# Patient Record
Sex: Male | Born: 1980 | Race: Black or African American | Hispanic: No | Marital: Married | Smoking: Former smoker
Health system: Southern US, Community
[De-identification: ages and names within clinical notes are randomized; demographics above are authoritative.]

## PROBLEM LIST (undated history)

## (undated) ENCOUNTER — Emergency Department (HOSPITAL_COMMUNITY): Admission: EM | Payer: BLUE CROSS/BLUE SHIELD | Source: Home / Self Care

## (undated) DIAGNOSIS — G5600 Carpal tunnel syndrome, unspecified upper limb: Secondary | ICD-10-CM

## (undated) DIAGNOSIS — F4310 Post-traumatic stress disorder, unspecified: Secondary | ICD-10-CM

## (undated) DIAGNOSIS — G47411 Narcolepsy with cataplexy: Secondary | ICD-10-CM

## (undated) DIAGNOSIS — H00019 Hordeolum externum unspecified eye, unspecified eyelid: Secondary | ICD-10-CM

## (undated) HISTORY — PX: ORIF MANDIBULAR FRACTURE: SHX2127

## (undated) HISTORY — DX: Hordeolum externum unspecified eye, unspecified eyelid: H00.019

## (undated) HISTORY — DX: Narcolepsy with cataplexy: G47.411

---

## 2016-09-14 ENCOUNTER — Ambulatory Visit (HOSPITAL_COMMUNITY)
Admission: EM | Admit: 2016-09-14 | Discharge: 2016-09-14 | Disposition: A | Discharge status: Home / Self Care | Payer: BLUE CROSS/BLUE SHIELD | Attending: Family Medicine | Admitting: Family Medicine

## 2016-09-14 ENCOUNTER — Encounter (HOSPITAL_COMMUNITY): Payer: Self-pay | Admitting: *Deleted

## 2016-09-14 DIAGNOSIS — H00024 Hordeolum internum left upper eyelid: Secondary | ICD-10-CM

## 2016-09-14 NOTE — ED Provider Notes (Signed)
[facility]    CSN: [id number] Arrival date & time: 09/14/16  1438   History   Chief Complaint Chief Complaint  Patient presents with  . Eye Problem    HPI Oscar Mercado is a 36 y.o. male presenting for left eyelid swelling and tenderness.   He's had 3 days of constant, worsening, gradual onset left upper eyelid swelling despite frequent warm compresses. This is a recurrent problem for him which usually resolved over the course of 7-10 days. He presented today from work because the irritation was so bad. He reports intermittent blurring of vision but no pain with eye movements, no fever.   HPI  History reviewed. No pertinent past medical history.  There are no active problems to display for this patient.   History reviewed. No pertinent surgical history.   Home Medications    Prior to Admission medications   Not on File   Family History No family history on file.  Social History Social History  Substance Use Topics  . Smoking status: Former Games developermoker  . Smokeless tobacco: Never Used  . Alcohol use No     Allergies   Patient has no known allergies.   Review of Systems Review of Systems Per HPI  Physical Exam Triage Vital Signs ED Triage Vitals  Enc Vitals Group     BP 09/14/16 1508 130/80     Pulse Rate 09/14/16 1508 78     Resp 09/14/16 1508 18     Temp 09/14/16 1508 98.9 F (37.2 C)     Temp Source 09/14/16 1508 Oral     SpO2 09/14/16 1508 100 %     Weight --      Height --      Head Circumference --      Peak Flow --      Pain Score 09/14/16 1502 5     Pain Loc --      Pain Edu? --      Excl. in GC? --    No data found.   Updated Vital Signs BP 130/80 (BP Location: Right Arm)   Pulse 78   Temp 98.9 F (37.2 C) (Oral)   Resp 18   SpO2 100%   Visual Acuity Right Eye Distance: 20/13 Left Eye Distance: 20/15 Bilateral Distance: 20/13  Physical Exam  Constitutional: He appears well-developed and well-nourished. No  distress.  HENT:  Head: Normocephalic.  Eyes:  Left upper eyelid diffusely edematous without erythema or focal induration/purulence. Conjunctivae normal, sclerae normal, PERRL, painless EOM's, and visual acuity not impaired.    UC Treatments / Results  Labs (all labs ordered are listed, but only abnormal results are displayed) Labs Reviewed - No data to display  EKG  EKG Interpretation None       Radiology No results found.  Procedures Procedures (including critical care time)  Medications Ordered in UC Medications - No data to display   Initial Impression / Assessment and Plan / UC Course  I have reviewed the triage vital signs and the nursing notes.  Pertinent labs & imaging results that were available during my care of the patient were reviewed by me and considered in my medical decision making (see chart for details).  Final Clinical Impressions(s) / UC Diagnoses   Final diagnoses:  Hordeolum internum left upper eyelid   36 y.o. male with evidence of left upper eyelid hordeolum, recurrent. Visual acuity not impaired, no evidence of cellulitis.  - Warm compresses and massage to facilitate drainage -  Referred to ophthalmology if no resolution in 1 - 2 weeks, especially given recurrent nature.    Tyrone Nine, MD 09/14/16 [phone]

## 2016-09-14 NOTE — ED Triage Notes (Signed)
Noticed   A  Bump  On  l    Eye     X    sev   Days    Swelling     Eye  Area      Irritation       When  He  Blinks  Has  Had  This  Problem in  Past

## 2018-03-17 ENCOUNTER — Ambulatory Visit (HOSPITAL_COMMUNITY)
Admission: EM | Admit: 2018-03-17 | Discharge: 2018-03-17 | Disposition: A | Discharge status: Home / Self Care | Payer: BLUE CROSS/BLUE SHIELD | Attending: Internal Medicine | Admitting: Internal Medicine

## 2018-03-17 ENCOUNTER — Encounter (HOSPITAL_COMMUNITY): Payer: Self-pay

## 2018-03-17 DIAGNOSIS — G4710 Hypersomnia, unspecified: Secondary | ICD-10-CM | POA: Insufficient documentation

## 2018-03-17 NOTE — ED Triage Notes (Signed)
Pt states needs a referral to a specialist

## 2018-03-17 NOTE — ED Triage Notes (Signed)
Pt c/o sleep disorder x1 yr where he is sleeping to much; denies pain or any other complaints

## 2018-03-17 NOTE — Discharge Instructions (Signed)
Contact information for some local sleep specialists and primary care providers is located in this handout.

## 2018-03-17 NOTE — ED Provider Notes (Signed)
[facility]    CSN: [id number] Arrival date & time: 03/17/18  1608     History   Chief Complaint Chief Complaint  Patient presents with  . sleeping disorder    HPI Oscar Mercado is a 38 y.o. male.   He presents today with a 1 years history of increasing sleepiness.  This is affecting his job performance.  He works nights, 4 nights per week, as a stand-up fork truck driver.  He has not had any accidents, and has not fallen down, but feels sometimes like his knees might buckle unexpectedly and he might fall.  He also feels like his speech might be slurred sometimes but he has checked with friends/family and they do not agree that his speech is affected. He gives a history of snoring and has been told several times that he snores.  He typically sleeps 5 to 6 hours per night and feels well rested about 40% of the time, after sleeping.  He was in the [employer] for 10 years and was told there that only 4 hours of sleep per night is required. He is concerned about narcolepsy/cataplexy.    HPI  History reviewed. No pertinent past medical history.  History reviewed. No pertinent surgical history.     Home Medications   Takes no medications regularly  Family History No family history on file.  Social History Social History   Tobacco Use  . Smoking status: Former Games developer  . Smokeless tobacco: Never Used  Substance Use Topics  . Alcohol use: No  . Drug use: Not on file     Allergies   Patient has no known allergies.   Review of Systems Review of Systems  All other systems reviewed and are negative.    Physical Exam Triage Vital Signs ED Triage Vitals  Enc Vitals Group     BP 03/17/18 1701 113/72     Pulse Rate 03/17/18 1701 78     Resp 03/17/18 1701 18     Temp 03/17/18 1701 98.8 F (37.1 C)     Temp Source 03/17/18 1701 Oral     SpO2 03/17/18 1701 98 %     Weight --      Height --      Pain Score 03/17/18 1702 0     Pain Loc --    Updated  Vital Signs BP 113/72 (BP Location: Left Arm)   Pulse 78   Temp 98.8 F (37.1 C) (Oral)   Resp 18   SpO2 98%  Physical Exam Vitals signs and nursing note reviewed.  Constitutional:      General: He is not in acute distress.    Comments: Alert, nicely groomed Sitting upright on exam table.  Affect is blunted, and appears tired.  Is awake and attentive throughout the entire exam.  HENT:     Head: Atraumatic.  Eyes:     Comments: Conjugate gaze, no eye redness/drainage  Neck:     Musculoskeletal: Neck supple.  Cardiovascular:     Rate and Rhythm: Normal rate and regular rhythm.  Pulmonary:     Effort: No respiratory distress.     Breath sounds: No wheezing or rales.     Comments: Lungs clear, symmetric breath sounds  Abdominal:     General: There is no distension.  Musculoskeletal: Normal range of motion.     Comments: No leg swelling  Skin:    General: Skin is warm and dry.     Comments: No cyanosis  Neurological:  Mental Status: He is alert and oriented to person, place, and time.       Final Clinical Impressions(s) / UC Diagnoses   Final diagnoses:  Hypersomnia     Discharge Instructions     Contact information for some local sleep specialists and primary care providers is located in this handout.     ED Prescriptions    None        Isa RankinMurray, Rafeef Lau Wilson, MD 03/19/18 2212

## 2018-03-23 ENCOUNTER — Telehealth (HOSPITAL_COMMUNITY): Payer: Self-pay | Admitting: Emergency Medicine

## 2018-03-23 NOTE — Telephone Encounter (Signed)
Patient called and left voicemail asking for referral for sleep study. We do not do referrals at [facility], attempted to contact patient and encourage him to follow up with PCP for referral. No answer.

## 2018-05-13 ENCOUNTER — Emergency Department (HOSPITAL_COMMUNITY): Payer: BLUE CROSS/BLUE SHIELD

## 2018-05-13 ENCOUNTER — Encounter (HOSPITAL_COMMUNITY): Payer: Self-pay | Admitting: Emergency Medicine

## 2018-05-13 ENCOUNTER — Emergency Department (HOSPITAL_COMMUNITY)
Admission: EM | Admit: 2018-05-13 | Discharge: 2018-05-13 | Disposition: A | Discharge status: Home / Self Care | Payer: BLUE CROSS/BLUE SHIELD | Attending: Emergency Medicine | Admitting: Emergency Medicine

## 2018-05-13 DIAGNOSIS — Y929 Unspecified place or not applicable: Secondary | ICD-10-CM | POA: Diagnosis not present

## 2018-05-13 DIAGNOSIS — Y999 Unspecified external cause status: Secondary | ICD-10-CM | POA: Insufficient documentation

## 2018-05-13 DIAGNOSIS — X58XXXA Exposure to other specified factors, initial encounter: Secondary | ICD-10-CM | POA: Insufficient documentation

## 2018-05-13 DIAGNOSIS — Z87891 Personal history of nicotine dependence: Secondary | ICD-10-CM | POA: Insufficient documentation

## 2018-05-13 DIAGNOSIS — S39012A Strain of muscle, fascia and tendon of lower back, initial encounter: Secondary | ICD-10-CM | POA: Insufficient documentation

## 2018-05-13 DIAGNOSIS — R1032 Left lower quadrant pain: Secondary | ICD-10-CM | POA: Diagnosis present

## 2018-05-13 DIAGNOSIS — Y9389 Activity, other specified: Secondary | ICD-10-CM | POA: Insufficient documentation

## 2018-05-13 LAB — URINALYSIS, ROUTINE W REFLEX MICROSCOPIC
Bilirubin Urine: NEGATIVE
Glucose, UA: NEGATIVE mg/dL
HGB URINE DIPSTICK: NEGATIVE
Ketones, ur: NEGATIVE mg/dL
Leukocytes,Ua: NEGATIVE
Nitrite: NEGATIVE
PH: 6 (ref 5.0–8.0)
PROTEIN: NEGATIVE mg/dL
SPECIFIC GRAVITY, URINE: 1.005 (ref 1.005–1.030)

## 2018-05-13 IMAGING — CT CT RENAL STONE PROTOCOL
2 of 4 series · 15 of 46 positions shown, 17 images · non-contrast
Comparison: None.

CLINICAL DATA: Left flank pain for a week.

EXAM:
CT ABDOMEN AND PELVIS WITHOUT CONTRAST
TECHNIQUE: Multidetector CT imaging of the abdomen and pelvis was performed
following the standard protocol without IV contrast.

[Series 3: renal stone 5.0 · axial · 0.61mm/px · z∈[+822,+1207]mm · 12 of 89 slices shown, 14 images]
[im 8/89  soft-tissue]
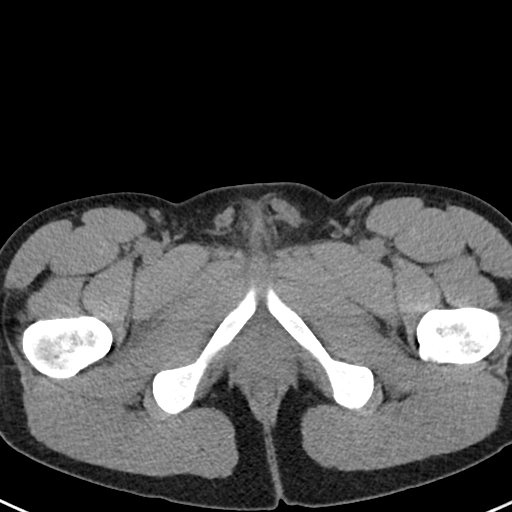
[im 8/89  bone]
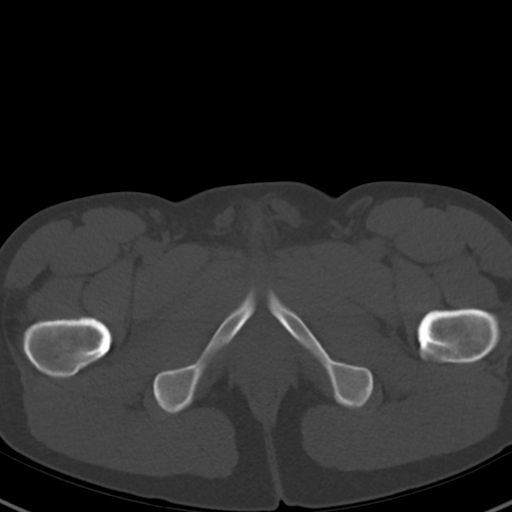
[im 15/89  soft-tissue]
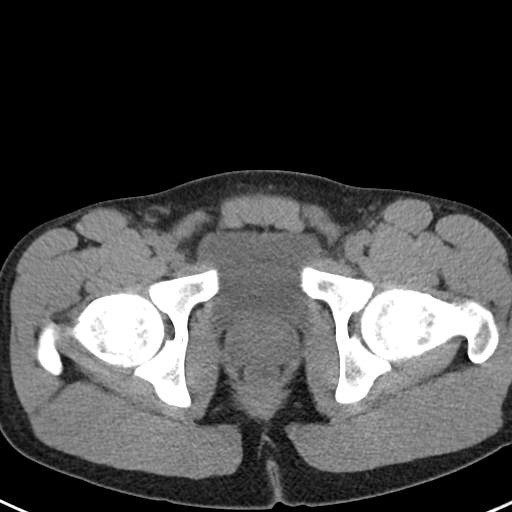
[im 22/89  soft-tissue]
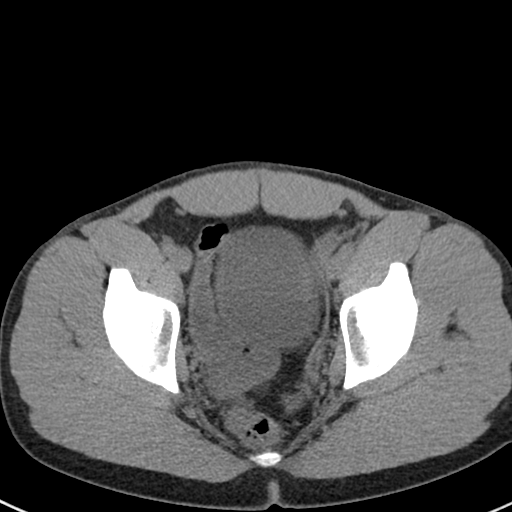
[im 29/89  soft-tissue]
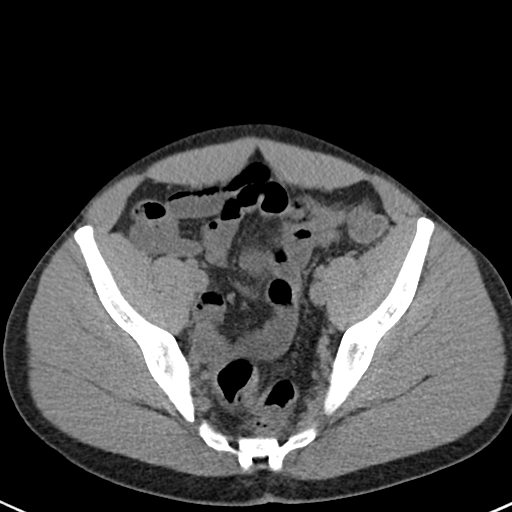
[im 36/89  soft-tissue]
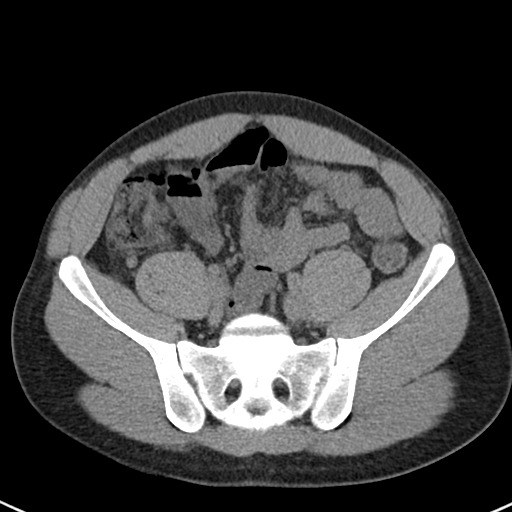
[im 43/89  soft-tissue]
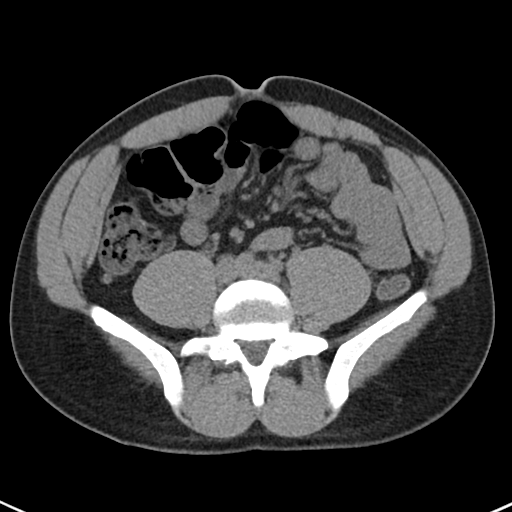
[im 50/89  soft-tissue]
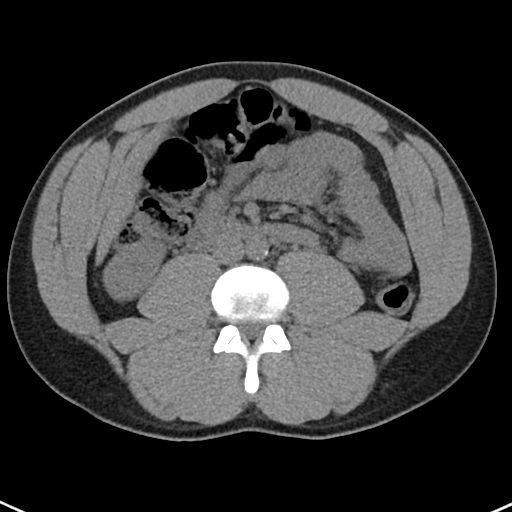
[im 57/89  soft-tissue]
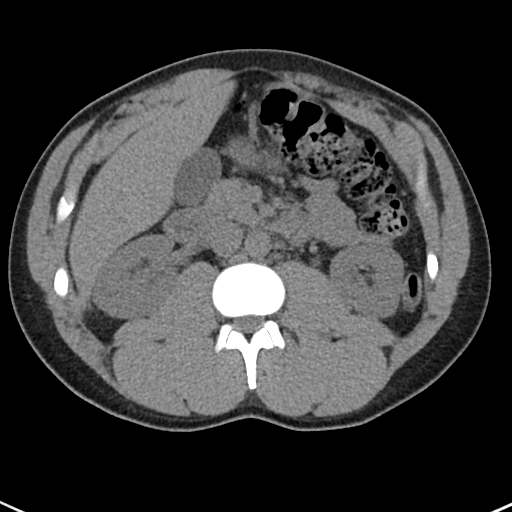
[im 64/89  soft-tissue]
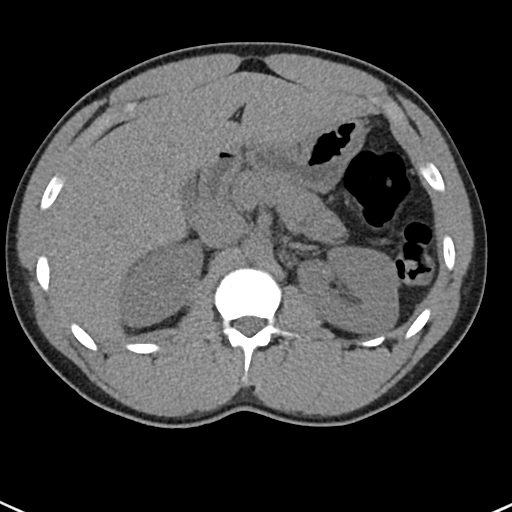
[im 64/89  bone]
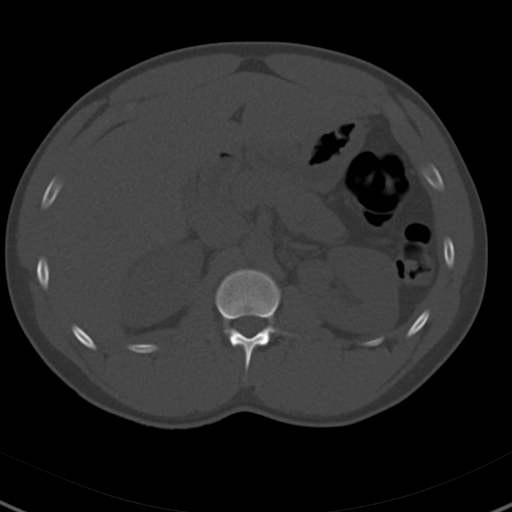
[im 71/89  soft-tissue]
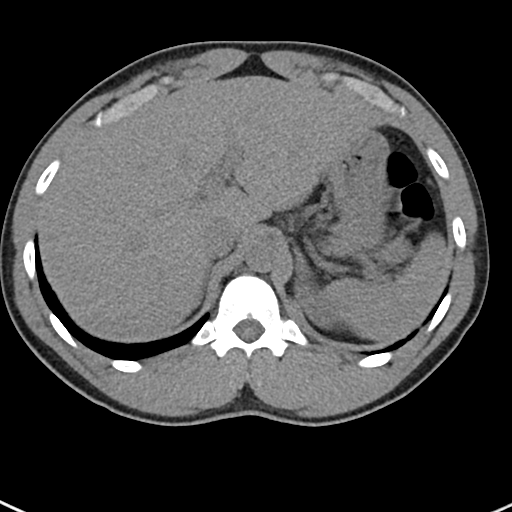
[im 78/89  soft-tissue]
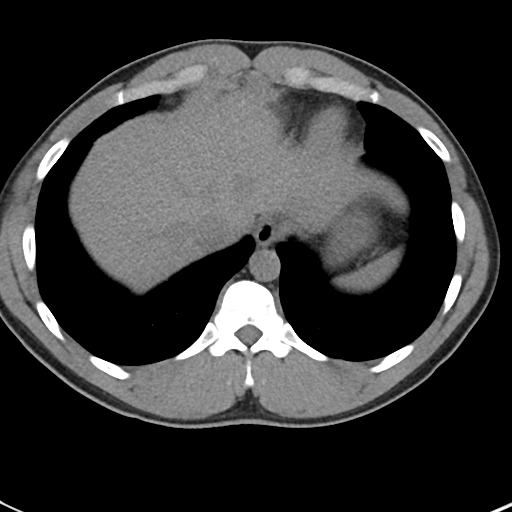
[im 85/89  soft-tissue]
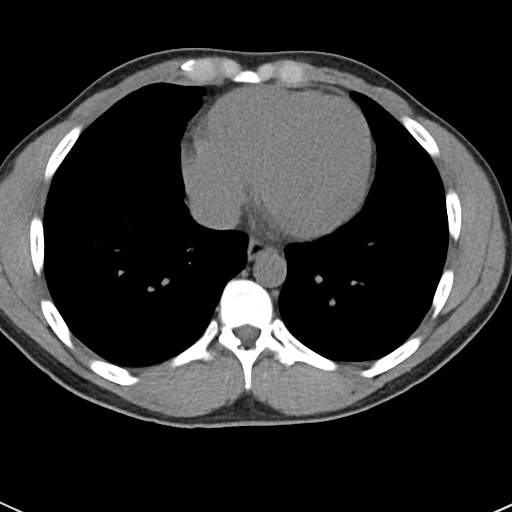

[Series 5: renal stone 3.0 cor · coronal · 0.58mm/px · 3 of 101 slices shown]
[im 34/101  soft-tissue]
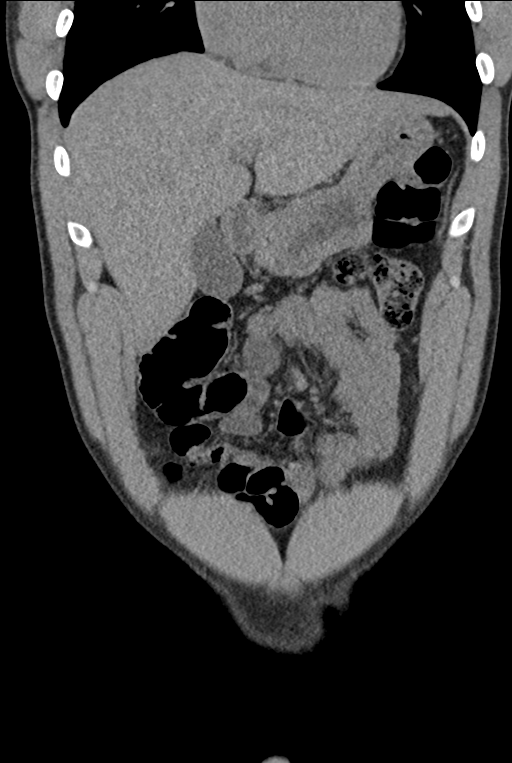
[im 45/101  soft-tissue]
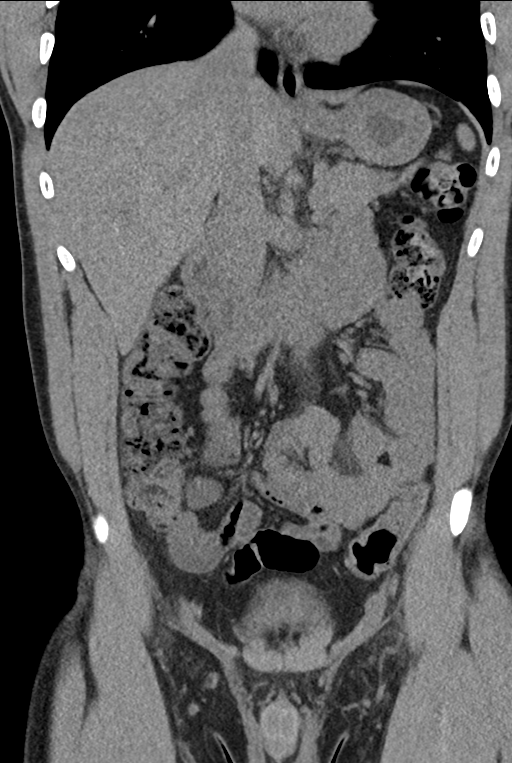
[im 56/101  soft-tissue]
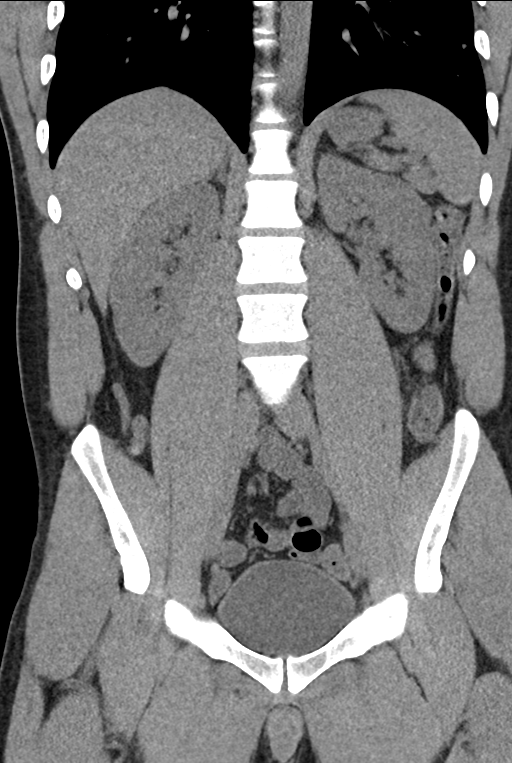

[15 of 46 positions shown; findings below may reference images not displayed]

FINDINGS: Lower chest: There are scattered nodules in the lung bases. A
partially visualized nodule along the right fissure on series 7,
image 1 measures 6.8 mm. A small adjacent nodule in the right base
on image 2 measures 2.5 mm. Another nodule in the right middle lobe
measures 4.1 mm on image 4. A nodule in the left base on series 7,
image 6 measures 4 mm. A few other tiny scattered nodules are seen
in the lung bases. No focal infiltrates. Lung bases otherwise
normal.

Hepatobiliary: No focal liver abnormality is seen. No gallstones,
gallbladder wall thickening, or biliary dilatation.

Pancreas: Unremarkable. No pancreatic ductal dilatation or
surrounding inflammatory changes.

Spleen: Normal in size without focal abnormality.

Adrenals/Urinary Tract: Adrenal glands are normal. No renal stones,
masses, or hydronephrosis. No ureterectasis or ureteral stones. The
bladder is unremarkable.

Stomach/Bowel: Stomach is within normal limits. Appendix appears
normal. No evidence of bowel wall thickening, distention, or
inflammatory changes.

Vascular/Lymphatic: No significant vascular findings are present. No
enlarged abdominal or pelvic lymph nodes.

Reproductive: Prostate is unremarkable.

Other: No abdominal wall hernia or abnormality. No abdominopelvic
ascites.

Musculoskeletal: No acute or significant osseous findings.
IMPRESSION: 1. Numerous scattered nodules are seen in the lung bases. The
largest is seen along the right fissure measuring 6.8 mm.
Non-contrast chest CT at 3-6 months is recommended. If the nodules
are stable at time of repeat CT, then future CT at 18-24 months
(from today's scan) is considered optional for low-risk patients,
but is recommended for high-risk patients. This recommendation
follows the consensus statement: Guidelines for Management of
Incidental Pulmonary Nodules Detected on CT Images: From the
2. No cause for left flank pain identified.

## 2018-05-13 MED ORDER — KETOROLAC TROMETHAMINE 30 MG/ML IJ SOLN
30.0000 mg | Freq: Once | INTRAMUSCULAR | Status: AC
  Administered 2018-05-13: 30 mg via INTRAMUSCULAR
  Filled 2018-05-13: qty 1

## 2018-05-13 MED ORDER — NAPROXEN 500 MG PO TABS: 500.0000 mg | ORAL_TABLET | Freq: Two times a day (BID) | ORAL | 0 refills | Status: DC

## 2018-05-13 MED ORDER — METHOCARBAMOL 500 MG PO TABS
500.0000 mg | ORAL_TABLET | Freq: Two times a day (BID) | ORAL | 0 refills | Status: DC
Start: 2018-05-13 — End: 2018-07-21

## 2018-05-13 MED ORDER — METHOCARBAMOL 500 MG PO TABS
500.0000 mg | ORAL_TABLET | Freq: Once | ORAL | Status: AC
  Administered 2018-05-13: 500 mg via ORAL
  Filled 2018-05-13: qty 1

## 2018-05-13 NOTE — ED Triage Notes (Signed)
Pt here with c/o left flank pain that started this week , no n/v

## 2018-05-13 NOTE — ED Notes (Signed)
Patient verbalizes understanding of discharge instructions . Opportunity for questions and answers were provided . Armband removed by staff ,Pt discharged from ED. W/C  offered at D/C  and Declined W/C at D/C and was escorted to lobby by RN.  

## 2018-05-13 NOTE — Discharge Instructions (Addendum)
Your CT showed incidental findings noted below: Numerous scattered nodules are seen in the lung bases. The largest is seen along the right fissure measuring 6.8 mm. Non-contrast chest CT at 3-6 months is recommended. If the nodules are stable at time of repeat CT, then future CT at 18-24 months (from today's scan) is considered optional for low-risk patients, but is recommended for high-risk patients. This recommendation follows the consensus statement: Guidelines for Management of Incidental Pulmonary Nodules Detected on CT Images: From the Fleischner Society 2017; Radiology 2017; 284:228-243.  Please follow-up with your primary care provider regarding this. Take the following medications to help with your symptoms. Applying heat, stretching and massaging the area may also help. Return to the ED for worsening symptoms, injuries or falls, numbness in arms or legs, leg swelling.

## 2018-05-13 NOTE — ED Provider Notes (Signed)
[facility] EMERGENCY DEPARTMENT Provider Note   CSN: [id number] Arrival date & time: 05/13/18  1535    History   Chief Complaint Chief Complaint  Patient presents with  . Flank Pain    HPI Oscar Mercado is a 38 y.o. male who presents to ED with a chief complaint of L flank pain. Symptoms began 6 days ago. He describes sharp pain in his L flank, worse with movement and coughing. Dull ache when sitting still. Has not tried any medications to help with symptoms.  No history of kidney stones in the past.  Denies any urinary symptoms, vomiting, changes to bowel movements, fever.     HPI  History reviewed. No pertinent past medical history.  There are no active problems to display for this patient.   History reviewed. No pertinent surgical history.      Home Medications    Prior to Admission medications   Medication Sig Start Date End Date Taking? Authorizing Provider  methocarbamol (ROBAXIN) 500 MG tablet Take 1 tablet (500 mg total) by mouth 2 (two) times daily. 05/13/18   Nyree Yonker, PA-C  naproxen (NAPROSYN) 500 MG tablet Take 1 tablet (500 mg total) by mouth 2 (two) times daily. 05/13/18   Dietrich Pates, PA-C    Family History History reviewed. No pertinent family history.  Social History Social History   Tobacco Use  . Smoking status: Former Games developer  . Smokeless tobacco: Never Used  Substance Use Topics  . Alcohol use: No  . Drug use: Not on file     Allergies   Patient has no known allergies.   Review of Systems Review of Systems  Constitutional: Negative for appetite change, chills and fever.  HENT: Negative for ear pain, rhinorrhea, sneezing and sore throat.   Eyes: Negative for photophobia and visual disturbance.  Respiratory: Negative for cough, chest tightness, shortness of breath and wheezing.   Cardiovascular: Negative for chest pain and palpitations.  Gastrointestinal: Negative for abdominal pain, blood in stool,  constipation, diarrhea, nausea and vomiting.  Genitourinary: Positive for flank pain. Negative for dysuria, hematuria and urgency.  Musculoskeletal: Negative for myalgias.  Skin: Negative for rash.  Neurological: Negative for dizziness, weakness and light-headedness.     Physical Exam Updated Vital Signs BP 113/75 (BP Location: Right Arm)   Pulse (!) 58   Temp 98 F (36.7 C) (Oral)   Resp 14   SpO2 100%   Physical Exam Vitals signs and nursing note reviewed.  Constitutional:      General: He is not in acute distress.    Appearance: He is well-developed.  HENT:     Head: Normocephalic and atraumatic.     Nose: Nose normal.  Eyes:     General: No scleral icterus.       Left eye: No discharge.     Conjunctiva/sclera: Conjunctivae normal.  Neck:     Musculoskeletal: Normal range of motion and neck supple.  Cardiovascular:     Rate and Rhythm: Normal rate and regular rhythm.     Heart sounds: Normal heart sounds. No murmur. No friction rub. No gallop.   Pulmonary:     Effort: Pulmonary effort is normal. No respiratory distress.     Breath sounds: Normal breath sounds.  Abdominal:     General: Bowel sounds are normal. There is no distension.     Palpations: Abdomen is soft.     Tenderness: There is no abdominal tenderness. There is no right CVA tenderness, left CVA  tenderness or guarding.  Musculoskeletal: Normal range of motion.     Comments: No midline spinal tenderness present in lumbar, thoracic or cervical spine. No step-off palpated. No visible bruising, edema or temperature change noted. No objective signs of numbness present. No saddle anesthesia. 2+ DP pulses bilaterally. Sensation intact to light touch. Strength 5/5 in bilateral lower extremities.  Skin:    General: Skin is warm and dry.     Findings: No rash.  Neurological:     Mental Status: He is alert.     Motor: No abnormal muscle tone.     Coordination: Coordination normal.      ED Treatments / Results    Labs (all labs ordered are listed, but only abnormal results are displayed) Labs Reviewed  URINALYSIS, ROUTINE W REFLEX MICROSCOPIC - Abnormal; Notable for the following components:      Result Value   Color, Urine STRAW (*)    All other components within normal limits    EKG None  Radiology Ct Renal Stone Study  Result Date: 05/13/2018 CLINICAL DATA:  Left flank pain for a week. EXAM: CT ABDOMEN AND PELVIS WITHOUT CONTRAST TECHNIQUE: Multidetector CT imaging of the abdomen and pelvis was performed following the standard protocol without IV contrast. COMPARISON:  None. FINDINGS: Lower chest: There are scattered nodules in the lung bases. A partially visualized nodule along the right fissure on series 7, image 1 measures 6.8 mm. A small adjacent nodule in the right base on image 2 measures 2.5 mm. Another nodule in the right middle lobe measures 4.1 mm on image 4. A nodule in the left base on series 7, image 6 measures 4 mm. A few other tiny scattered nodules are seen in the lung bases. No focal infiltrates. Lung bases otherwise normal. Hepatobiliary: No focal liver abnormality is seen. No gallstones, gallbladder wall thickening, or biliary dilatation. Pancreas: Unremarkable. No pancreatic ductal dilatation or surrounding inflammatory changes. Spleen: Normal in size without focal abnormality. Adrenals/Urinary Tract: Adrenal glands are normal. No renal stones, masses, or hydronephrosis. No ureterectasis or ureteral stones. The bladder is unremarkable. Stomach/Bowel: Stomach is within normal limits. Appendix appears normal. No evidence of bowel wall thickening, distention, or inflammatory changes. Vascular/Lymphatic: No significant vascular findings are present. No enlarged abdominal or pelvic lymph nodes. Reproductive: Prostate is unremarkable. Other: No abdominal wall hernia or abnormality. No abdominopelvic ascites. Musculoskeletal: No acute or significant osseous findings. IMPRESSION: 1. Numerous  scattered nodules are seen in the lung bases. The largest is seen along the right fissure measuring 6.8 mm. Non-contrast chest CT at 3-6 months is recommended. If the nodules are stable at time of repeat CT, then future CT at 18-24 months (from today's scan) is considered optional for low-risk patients, but is recommended for high-risk patients. This recommendation follows the consensus statement: Guidelines for Management of Incidental Pulmonary Nodules Detected on CT Images: From the Fleischner Society 2017; Radiology 2017; 284:228-243. 2. No cause for left flank pain identified. Electronically Signed   By: Gerome Sam III M.D   On: 05/13/2018 17:50    Procedures Procedures (including critical care time)  Medications Ordered in ED Medications  methocarbamol (ROBAXIN) tablet 500 mg (500 mg Oral Given 05/13/18 1813)  ketorolac (TORADOL) 30 MG/ML injection 30 mg (30 mg Intramuscular Given 05/13/18 1813)     Initial Impression / Assessment and Plan / ED Course  I have reviewed the triage vital signs and the nursing notes.  Pertinent labs & imaging results that were available during my care  of the patient were reviewed by me and considered in my medical decision making (see chart for details).        38 year old male presents to ED for left flank pain.  Pain began 6 days ago.  Worse with movement, palpation and coughing.  Pain began when he was coughing.  No history of kidney stones in the past.  Urinalysis is unremarkable.  No deficits neurological exam noted of lower extremities.  No tenderness palpation noted on my exam.  CT renal stone study shows no acute findings.  Incidental findings of multiple lung nodules which I informed the patient regarding follow-up.  Suspect that his symptoms are musculoskeletal in nature. Patient denies any concerning symptoms suggestive of cauda equina requiring urgent imaging at this time such as loss of sensation in the lower extremities, lower extremity  weakness, loss of bowel or bladder control, saddle anesthesia, urinary retention, fever/chills, IVDU. Exam demonstrated no  weakness on exam today. No preceding injury or trauma to suggest acute fracture. Doubt pelvic or urinary pathology for patient's acute back pain, as patient denies urinary symptoms, has no evidence of infection on UA, has no CVA tenderness . Doubt AAA as cause of patient's back pain as patient lacks major risk factors, had no abdominal TTP, and has symmetric and intact distal pulses. Patient given strict return precautions for any symptoms indicating worsening neurologic function in the lower extremities.  Will give Robaxin and naproxen and advise heat therapy and stretching.  Patient is hemodynamically stable, in NAD, and able to ambulate in the ED. Evaluation does not show pathology that would require ongoing emergent intervention or inpatient treatment. I explained the diagnosis to the patient. Pain has been managed and has no complaints prior to discharge. Patient is comfortable with above plan and is stable for discharge at this time. All questions were answered prior to disposition. Strict return precautions for returning to the ED were discussed. Encouraged follow up with PCP.    Portions of this note were generated with Scientist, clinical (histocompatibility and immunogenetics). Dictation errors may occur despite best attempts at proofreading.   Final Clinical Impressions(s) / ED Diagnoses   Final diagnoses:  Strain of lumbar region, initial encounter    ED Discharge Orders         Ordered    naproxen (NAPROSYN) 500 MG tablet  2 times daily     05/13/18 1810    methocarbamol (ROBAXIN) 500 MG tablet  2 times daily     05/13/18 1810           Dietrich Pates, PA-C 05/13/18 1819    Shaune Pollack, MD 05/14/18 [id number]

## 2018-06-19 ENCOUNTER — Emergency Department (HOSPITAL_COMMUNITY)
Admission: EM | Admit: 2018-06-19 | Discharge: 2018-06-19 | Disposition: A | Discharge status: Home / Self Care | Payer: BLUE CROSS/BLUE SHIELD | Attending: Emergency Medicine | Admitting: Emergency Medicine

## 2018-06-19 ENCOUNTER — Encounter (HOSPITAL_COMMUNITY): Payer: Self-pay | Admitting: Emergency Medicine

## 2018-06-19 ENCOUNTER — Other Ambulatory Visit: Payer: Self-pay

## 2018-06-19 DIAGNOSIS — H5711 Ocular pain, right eye: Secondary | ICD-10-CM | POA: Diagnosis present

## 2018-06-19 DIAGNOSIS — H00012 Hordeolum externum right lower eyelid: Secondary | ICD-10-CM | POA: Diagnosis not present

## 2018-06-19 DIAGNOSIS — Z87891 Personal history of nicotine dependence: Secondary | ICD-10-CM | POA: Insufficient documentation

## 2018-06-19 MED ORDER — ERYTHROMYCIN 5 MG/GM OP OINT: TOPICAL_OINTMENT | OPHTHALMIC | 0 refills | Status: DC

## 2018-06-19 NOTE — ED Triage Notes (Signed)
Pt here for eye pain and swelling to left eye

## 2018-06-19 NOTE — ED Provider Notes (Signed)
[facility] Provider Note   CSN: [id number] Arrival date & time: 06/19/18  1213    History   Chief Complaint Chief Complaint  Patient presents with  . Eye Pain    HPI Oscar ShipChristopher Mercado is a 38 y.o. male.     HPI   38 year old male presents today with complaints of stye.  Patient notes approximately 1 week ago he developed irritation to his right lower eyelid.  He notes this is typical of previous styes.  He notes he has been using warm compress at home but the stye has not come to ahead.  He denies any vision changes or eye pain.  Patient notes he gets approximately 3 of these a year.  She is concerned that the stye is caused by coronavirus.  History reviewed. No pertinent past medical history.  There are no active problems to display for this patient.   History reviewed. No pertinent surgical history.      Home Medications    Prior to Admission medications   Medication Sig Start Date End Date Taking? Authorizing Provider  erythromycin ophthalmic ointment Place a 1/2 inch ribbon of ointment into the lower eyelid. 06/19/18   Mariselda Badalamenti, Tinnie GensJeffrey, PA-C  methocarbamol (ROBAXIN) 500 MG tablet Take 1 tablet (500 mg total) by mouth 2 (two) times daily. 05/13/18   Khatri, Hina, PA-C  naproxen (NAPROSYN) 500 MG tablet Take 1 tablet (500 mg total) by mouth 2 (two) times daily. 05/13/18   Dietrich PatesKhatri, Hina, PA-C    Family History History reviewed. No pertinent family history.  Social History Social History   Tobacco Use  . Smoking status: Former Games developermoker  . Smokeless tobacco: Never Used  Substance Use Topics  . Alcohol use: No  . Drug use: Not on file     Allergies   Patient has no known allergies.   Review of Systems Review of Systems  All other systems reviewed and are negative.   Physical Exam Updated Vital Signs BP (!) 141/89 (BP Location: Right Arm)   Pulse 92   Temp 98 F (36.7 C) (Oral)   Resp 18   SpO2 99%   Physical  Exam Vitals signs and nursing note reviewed.  Constitutional:      Appearance: He is well-developed.  HENT:     Head: Normocephalic and atraumatic.     Comments: Stye right lower lid-no discharge no conjunctival injection, vision normal bilateral- no surrounding erythema  Eyes:     General: No scleral icterus.       Right eye: No discharge.        Left eye: No discharge.     Conjunctiva/sclera: Conjunctivae normal.     Pupils: Pupils are equal, round, and reactive to light.  Neck:     Musculoskeletal: Normal range of motion.     Vascular: No JVD.     Trachea: No tracheal deviation.  Pulmonary:     Effort: Pulmonary effort is normal.     Breath sounds: No stridor.  Neurological:     Mental Status: He is alert and oriented to person, place, and time.     Coordination: Coordination normal.  Psychiatric:        Behavior: Behavior normal.        Thought Content: Thought content normal.        Judgment: Judgment normal.      ED Treatments / Results  Labs (all labs ordered are listed, but only abnormal results are displayed) Labs Reviewed - No  data to display  EKG None  Radiology No results found.  Procedures Procedures (including critical care time)  Medications Ordered in ED Medications - No data to display   Initial Impression / Assessment and Plan / ED Course  I have reviewed the triage vital signs and the nursing notes.  Pertinent labs & imaging results that were available during my care of the patient were reviewed by me and considered in my medical decision making (see chart for details).        Assessment/Plan: 38 year old male with stye to his right eye.  Patient will be placed on erythromycin Kirch continue warm soaks.  Return if he has any concerning signs or symptoms.  If symptoms persist he can follow-up as an outpatient with ophthalmology.  Patient verbalized understanding and agreement to today's plan      Final Clinical Impressions(s) / ED  Diagnoses   Final diagnoses:  Hordeolum externum of right lower eyelid    ED Discharge Orders         Ordered    erythromycin ophthalmic ointment     06/19/18 1228           Eyvonne Mechanic, PA-C 06/19/18 1426    Curatolo, Adam, DO 06/19/18 1633

## 2018-06-19 NOTE — Discharge Instructions (Addendum)
Please read attached information. If you experience any new or worsening signs or symptoms please return to the emergency room for evaluation. Please follow-up with your primary care provider or specialist as discussed. Please use medication prescribed only as directed and discontinue taking if you have any concerning signs or symptoms.   °

## 2018-06-21 ENCOUNTER — Ambulatory Visit (INDEPENDENT_AMBULATORY_CARE_PROVIDER_SITE_OTHER): Payer: BLUE CROSS/BLUE SHIELD | Admitting: Adult Health

## 2018-06-21 ENCOUNTER — Other Ambulatory Visit: Payer: Self-pay

## 2018-06-21 ENCOUNTER — Encounter: Payer: Self-pay | Admitting: Adult Health

## 2018-06-21 DIAGNOSIS — Z720 Tobacco use: Secondary | ICD-10-CM | POA: Diagnosis not present

## 2018-06-21 DIAGNOSIS — Z7689 Persons encountering health services in other specified circumstances: Secondary | ICD-10-CM | POA: Diagnosis not present

## 2018-06-21 DIAGNOSIS — H00012 Hordeolum externum right lower eyelid: Secondary | ICD-10-CM | POA: Diagnosis not present

## 2018-06-21 NOTE — Progress Notes (Signed)
Virtual Visit via Video Note  I connected withChristopher Fredric Mercado on 06/21/18 at  1:30 PM EDT by a video enabled telemedicine application and verified that I am speaking with the correct person using two identifiers.  Location patient: home Location provider:work or home office Persons participating in the virtual visit: patient, provider  I discussed the limitations of evaluation and management by telemedicine and the availability of in person appointments. The patient expressed understanding and agreed to proceed.   Patient presents to clinic today to establish care.  Acute Concerns: Establish Care  Chronic Issues: Stye- reports 3-4 a year. Was recently seen in the ER two days ago for this issue. Warm compresses work in the past. In the ER he was given ER eythromycin opt. Ointment. He reports that he has noticed improved in discomfort since starting the antibiotics but the size of the stye size has not started to diminish.   Tobacco Use - smokes about two black and milds a day.    Health Maintenance: Dental -- Does not do routine care  Vision -- Does not do routine care  Immunizations -- UTD  Diet: Tries to eat healthy but does not follow a specific diet    Past Medical History:  Diagnosis Date  . Stye     No past surgical history on file.  Current Outpatient Medications on File Prior to Visit  Medication Sig Dispense Refill  . erythromycin ophthalmic ointment Place a 1/2 inch ribbon of ointment into the lower eyelid. 1 g 0  . methocarbamol (ROBAXIN) 500 MG tablet Take 1 tablet (500 mg total) by mouth 2 (two) times daily. 20 tablet 0  . naproxen (NAPROSYN) 500 MG tablet Take 1 tablet (500 mg total) by mouth 2 (two) times daily. 30 tablet 0   No current facility-administered medications on file prior to visit.     No Known Allergies  Family History  Problem Relation Age of Onset  . Diabetes Maternal Grandmother     Social History   Socioeconomic History  .  Marital status: Single    Spouse name: Not on file  . Number of children: Not on file  . Years of education: Not on file  . Highest education level: Not on file  Occupational History  . Not on file  Social Needs  . Financial resource strain: Not on file  . Food insecurity:    Worry: Not on file    Inability: Not on file  . Transportation needs:    Medical: Not on file    Non-medical: Not on file  Tobacco Use  . Smoking status: Current Every Day Smoker    Types: Cigars  . Smokeless tobacco: Never Used  Substance and Sexual Activity  . Alcohol use: Yes  . Drug use: Yes    Types: Marijuana  . Sexual activity: Not on file  Lifestyle  . Physical activity:    Days per week: Not on file    Minutes per session: Not on file  . Stress: Not on file  Relationships  . Social connections:    Talks on phone: Not on file    Gets together: Not on file    Attends religious service: Not on file    Active member of club or organization: Not on file    Attends meetings of clubs or organizations: Not on file    Relationship status: Not on file  . Intimate partner violence:    Fear of current or ex partner: Not on  file    Emotionally abused: Not on file    Physically abused: Not on file    Forced sexual activity: Not on file  Other Topics Concern  . Not on file  Social History Narrative   Works in [employer]    Married        Review of Systems  Constitutional: Negative.   HENT: Negative.   Eyes: Positive for pain.  Respiratory: Negative.   Cardiovascular: Negative.   Gastrointestinal: Negative.   Genitourinary: Negative.   Musculoskeletal: Negative.   Skin: Negative.   Neurological: Negative.   Endo/Heme/Allergies: Negative.   All other systems reviewed and are negative.   There were no vitals taken for this visit.  Physical Exam   VITALS per patient if applicable:  GENERAL: alert, oriented, appears well and in no acute distress  HEENT: atraumatic,  conjunttiva clear, no obvious abnormalities on inspection of external nose and ears. Stye noted on bottom lid of right eye   NECK: normal movements of the head and neck  LUNGS: on inspection no signs of respiratory distress, breathing rate appears normal, no obvious gross SOB, gasping or wheezing  CV: no obvious cyanosis  MS: moves all visible extremities without noticeable abnormality  PSYCH/NEURO: pleasant and cooperative, no obvious depression or anxiety, speech and thought processing grossly intact  Recent Results (from the past 2160 hour(s))  Urinalysis, Routine w reflex microscopic     Status: Abnormal   Collection Time: 05/13/18  4:31 PM  Result Value Ref Range   Color, Urine STRAW (A) YELLOW   APPearance CLEAR CLEAR   Specific Gravity, Urine 1.005 1.005 - 1.030   pH 6.0 5.0 - 8.0   Glucose, UA NEGATIVE NEGATIVE mg/dL   Hgb urine dipstick NEGATIVE NEGATIVE   Bilirubin Urine NEGATIVE NEGATIVE   Ketones, ur NEGATIVE NEGATIVE mg/dL   Protein, ur NEGATIVE NEGATIVE mg/dL   Nitrite NEGATIVE NEGATIVE   Leukocytes,Ua NEGATIVE NEGATIVE    Comment: Performed at [facility], [address] [place], [place] [address]    Assessment/Plan:  Discussed the following assessment and plan:  Hordeolum externum of right lower eyelid  Encounter to establish care  Tobacco use  Names and phone numbers of local ophthalmologist given, like to have him follow-up there for further evaluation since he is getting styes so often.  Is to follow-up for routine CPE after COVID pandemic is over.  Courage to quit smoking   I discussed the assessment and treatment plan with the patient. The patient was provided an opportunity to ask questions and all were answered. The patient agreed with the plan and demonstrated an understanding of the instructions.   The patient was advised to call back or seek an in-person evaluation if the symptoms worsen or if the condition fails to improve as  anticipated.   Shirline Freesory Mirjana Tarleton, NP

## 2018-06-27 ENCOUNTER — Encounter: Payer: Self-pay | Admitting: Adult Health

## 2018-06-27 NOTE — Telephone Encounter (Signed)
Filled out and placed in Oscar Mercado's basket.

## 2018-07-21 ENCOUNTER — Emergency Department (HOSPITAL_COMMUNITY): Payer: BLUE CROSS/BLUE SHIELD

## 2018-07-21 ENCOUNTER — Other Ambulatory Visit: Payer: Self-pay

## 2018-07-21 ENCOUNTER — Emergency Department (HOSPITAL_COMMUNITY)
Admission: EM | Admit: 2018-07-21 | Discharge: 2018-07-21 | Disposition: A | Discharge status: Home / Self Care | Payer: BLUE CROSS/BLUE SHIELD | Attending: Emergency Medicine | Admitting: Emergency Medicine

## 2018-07-21 ENCOUNTER — Encounter (HOSPITAL_COMMUNITY): Payer: Self-pay | Admitting: Emergency Medicine

## 2018-07-21 DIAGNOSIS — F1290 Cannabis use, unspecified, uncomplicated: Secondary | ICD-10-CM | POA: Diagnosis not present

## 2018-07-21 DIAGNOSIS — M546 Pain in thoracic spine: Secondary | ICD-10-CM | POA: Insufficient documentation

## 2018-07-21 DIAGNOSIS — M542 Cervicalgia: Secondary | ICD-10-CM | POA: Diagnosis not present

## 2018-07-21 DIAGNOSIS — M545 Low back pain, unspecified: Secondary | ICD-10-CM

## 2018-07-21 DIAGNOSIS — F17290 Nicotine dependence, other tobacco product, uncomplicated: Secondary | ICD-10-CM | POA: Insufficient documentation

## 2018-07-21 HISTORY — DX: Post-traumatic stress disorder, unspecified: F43.10

## 2018-07-21 HISTORY — DX: Carpal tunnel syndrome, unspecified upper limb: G56.00
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
## 2018-07-21 MED ORDER — METHOCARBAMOL 500 MG PO TABS: 500.0000 mg | ORAL_TABLET | Freq: Two times a day (BID) | ORAL | 0 refills | Status: DC

## 2018-07-21 MED ORDER — METHOCARBAMOL 500 MG PO TABS
500.0000 mg | ORAL_TABLET | Freq: Once | ORAL | Status: AC
  Administered 2018-07-21: 14:00:00 500 mg via ORAL
  Filled 2018-07-21: qty 1

## 2018-07-21 MED ORDER — LIDOCAINE 5 % EX PTCH: 1.0000 | MEDICATED_PATCH | CUTANEOUS | 0 refills | Status: DC

## 2018-07-21 NOTE — ED Notes (Signed)
Patient transported to X-ray 
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
## 2018-07-21 NOTE — Discharge Instructions (Signed)
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
## 2018-08-25 ENCOUNTER — Encounter: Payer: Self-pay | Admitting: Adult Health

## 2018-08-25 ENCOUNTER — Other Ambulatory Visit: Payer: Self-pay

## 2018-08-25 ENCOUNTER — Ambulatory Visit (INDEPENDENT_AMBULATORY_CARE_PROVIDER_SITE_OTHER): Payer: BC Managed Care – PPO | Admitting: Adult Health

## 2018-08-25 DIAGNOSIS — S060X0A Concussion without loss of consciousness, initial encounter: Secondary | ICD-10-CM

## 2018-08-25 DIAGNOSIS — G479 Sleep disorder, unspecified: Secondary | ICD-10-CM
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
## 2018-10-13 ENCOUNTER — Other Ambulatory Visit: Payer: Self-pay

## 2018-10-13 ENCOUNTER — Institutional Professional Consult (permissible substitution): Payer: BC Managed Care – PPO | Admitting: Pulmonary Disease

## 2018-11-01 ENCOUNTER — Ambulatory Visit (INDEPENDENT_AMBULATORY_CARE_PROVIDER_SITE_OTHER): Payer: BC Managed Care – PPO | Admitting: Pulmonary Disease

## 2018-11-01 ENCOUNTER — Encounter: Payer: Self-pay | Admitting: Pulmonary Disease

## 2018-11-01 ENCOUNTER — Other Ambulatory Visit: Payer: Self-pay

## 2018-11-01 VITALS — BP 130/84 | HR 78 | Ht 66.0 in | Wt 151.0 lb

## 2018-11-01 DIAGNOSIS — R400 Somnolence: Secondary | ICD-10-CM | POA: Insufficient documentation

## 2018-11-01 DIAGNOSIS — G47411 Narcolepsy with cataplexy: Secondary | ICD-10-CM | POA: Diagnosis not present

## 2018-11-01 DIAGNOSIS — G4752 REM sleep behavior disorder: Secondary | ICD-10-CM | POA: Diagnosis not present
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
## 2018-11-01 NOTE — Assessment & Plan Note (Signed)
Episodes of anxiety and stress induced loss of muscle tension associated with occasional dyslexia.   Plan: Overnight sleep study and sleep latency test. May benefit from stimulants daily but will need further evaluation.

## 2018-11-01 NOTE — Assessment & Plan Note (Signed)
  Acts out dreams REM sleep disorder  Sleep movement disorder  CSA  Chronic sleep deprivation   Circadian rhythm disorder or non-twenty four

## 2018-11-01 NOTE — Patient Instructions (Signed)
Please keep a sleep diary for 2 weeks  Will arrange for in lab overnight polysomnogram followed by multiple sleep latency test  Will call to schedule follow up after sleep studies reviewed

## 2018-11-01 NOTE — Assessment & Plan Note (Addendum)
Patient with rapid onset dream states, insomnia, and acting out dreams during REM sleep. This has been noted by his wife. 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 He has delt with this for several months now and does not feel it to be improving.   Plan: Start with Nocturnal polysomnography for REM behavior disorder F/up with Dr. Halford Chessman

## 2018-11-01 NOTE — Assessment & Plan Note (Addendum)
Constantly feels tired. Falling asleep regularly at times he should not such as when playing with his children or driving. He does not experience sudden involuntary loss of muscle tone typical for narcolepsy with cataplexy. Although this could certainly be the result of a CSA I feel he is likely partially experiencing this from lack of quality sleep. However, the sleep deprivation could be due to an organic vs neurological sleep illness such as CSA, circadian rhythm disorder or non-24.   Plan: Overnight polysomnography  Multiple sleep latency test F/up with Dr. Halford Chessman
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
## 2018-11-06 ENCOUNTER — Ambulatory Visit (HOSPITAL_BASED_OUTPATIENT_CLINIC_OR_DEPARTMENT_OTHER): Payer: BC Managed Care – PPO | Attending: Pulmonary Disease | Admitting: Pulmonary Disease

## 2018-11-06 ENCOUNTER — Other Ambulatory Visit: Payer: Self-pay

## 2018-11-06 DIAGNOSIS — F329 Major depressive disorder, single episode, unspecified: Secondary | ICD-10-CM | POA: Diagnosis not present

## 2018-11-06 DIAGNOSIS — G47411 Narcolepsy with cataplexy: Secondary | ICD-10-CM | POA: Diagnosis not present

## 2018-11-06 DIAGNOSIS — G4752 REM sleep behavior disorder: Secondary | ICD-10-CM | POA: Insufficient documentation

## 2018-11-06 DIAGNOSIS — R400 Somnolence: Secondary | ICD-10-CM

## 2018-11-06 DIAGNOSIS — R0683 Snoring: Secondary | ICD-10-CM | POA: Insufficient documentation

## 2018-11-06 DIAGNOSIS — G4710 Hypersomnia, unspecified: Secondary | ICD-10-CM | POA: Diagnosis not present

## 2018-11-07 ENCOUNTER — Ambulatory Visit (HOSPITAL_BASED_OUTPATIENT_CLINIC_OR_DEPARTMENT_OTHER): Payer: BC Managed Care – PPO | Attending: Pulmonary Disease | Admitting: Pulmonary Disease

## 2018-11-07 ENCOUNTER — Other Ambulatory Visit: Payer: Self-pay

## 2018-11-07 VITALS — Ht 66.0 in | Wt 152.0 lb

## 2018-11-07 DIAGNOSIS — G4752 REM sleep behavior disorder: Secondary | ICD-10-CM | POA: Insufficient documentation

## 2018-11-07 DIAGNOSIS — G47411 Narcolepsy with cataplexy: Secondary | ICD-10-CM | POA: Diagnosis not present

## 2018-11-07 DIAGNOSIS — R400 Somnolence: Secondary | ICD-10-CM | POA: Diagnosis not present

## 2018-11-07 NOTE — Procedures (Signed)
    Patient Name: Oscar Mercado, Oscar Mercado Date: 11/06/2018 Gender: Male D.O.B: 09-23-1980 Age (years): 38 Referring Provider: Chesley Mires MD, ABSM Height (inches): 78 Interpreting Physician: Chesley Mires MD, ABSM Weight (lbs): 152 RPSGT: Zadie Rhine BMI: 25 MRN: [id number] Neck Size: 15.00  CLINICAL INFORMATION Sleep Study Type: NPSG  Indication for sleep study: Daytime Fatigue, Depression, Non-refreshing Sleep, Restless Sleep with Limb Movments  Epworth Sleepiness Score: 20  SLEEP STUDY TECHNIQUE As per the AASM Manual for the Scoring of Sleep and Associated Events v2.3 (April 2016) with a hypopnea requiring 4% desaturations.  The channels recorded and monitored were frontal, central and occipital EEG, electrooculogram (EOG), submentalis EMG (chin), nasal and oral airflow, thoracic and abdominal wall motion, anterior tibialis EMG, snore microphone, electrocardiogram, and pulse oximetry.  MEDICATIONS Medications self-administered by patient taken the night of the study : N/A  SLEEP ARCHITECTURE The study was initiated at 10:41:18 PM and ended at 6:00:27 AM.  Sleep onset time was 0.4 minutes and the sleep efficiency was 89.8%%. The total sleep time was 394.5 minutes.  Stage REM latency was 2.0 minutes.  The patient spent 5.4%% of the night in stage N1 sleep, 36.4%% in stage N2 sleep, 0.0%% in stage N3 and 58.2% in REM.  Alpha intrusion was absent.  Supine sleep was 63.11%.  RESPIRATORY PARAMETERS The overall apnea/hypopnea index (AHI) was 0.6 per hour. There were 2 total apneas, including 1 obstructive, 1 central and 0 mixed apneas. There were 2 hypopneas and 2 RERAs.  The AHI during Stage REM sleep was 0.8 per hour.  AHI while supine was 0.7 per hour.  The mean oxygen saturation was 95.1%. The minimum SpO2 during sleep was 89.0%.  moderate snoring was noted during this study.  CARDIAC DATA The 2 lead EKG demonstrated sinus rhythm. The mean heart rate was  63.5 beats per minute. Other EKG findings include: None.  LEG MOVEMENT DATA The total PLMS were 0 with a resulting PLMS index of 0.0. Associated arousal with leg movement index was 0.0 .  IMPRESSIONS - He reduced time to sleep onset (0.4 minutes). - He had total sleep time of 394.5 minutes (89.8% sleep efficiency). - He had very fragmented sleep with frequent episodes of REM sleep. - No significant sleep disordered breathing.  AHI was 0.6 with SpO2 low of 89%. - No significant periodic limb movements. - Moderate snoring was noted.  DIAGNOSIS - Hypersomnia  RECOMMENDATIONS - Please correlate with multiple sleep latency test findings.  [Electronically signed] 11/07/2018 04:49 PM  Chesley Mires MD, [place], American Board of Sleep Medicine   NPI: [id number]

## 2018-11-14 ENCOUNTER — Telehealth: Payer: Self-pay | Admitting: Pulmonary Disease

## 2018-11-14 DIAGNOSIS — G47411 Narcolepsy with cataplexy: Secondary | ICD-10-CM | POA: Diagnosis not present

## 2018-11-14 NOTE — Telephone Encounter (Signed)
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  Please arrange for ROV with me to discuss treatment options.

## 2018-11-14 NOTE — Procedures (Signed)
    Patient Name: Oscar Mercado, Oscar Mercado Date: 11/07/2018 Gender: Male D.O.B: [birth year] Age (years): 38 Referring Provider: Chesley Mires MD, ABSM Height (inches): 68 Interpreting Physician: Chesley Mires MD, ABSM Weight (lbs): 152 RPSGT: Jacolyn Reedy BMI: 25 MRN: [id number] Neck Size: 15.00  CLINICAL INFORMATION Sleep Study Type: MSLT  The patient was referred to the sleep center for evaluation of daytime sleepiness.  Epworth Sleepiness Score: 20  SLEEP STUDY TECHNIQUE A Multiple Sleep Latency Test was performed after an overnight polysomnogram according to the AASM scoring manual v2.3 (April 2016) and clinical guidelines. Five nap opportunities occurred over the course of the test which followed an overnight polysomnogram. The channels recorded and monitored were frontal, central, and occipital electroencephalography (EEG), right and left electrooculogram (EOG), chin electromyography (EMG), and electrocardiogram (EKG).  MEDICATIONS Medications taken by the patient : N/A Medications administered by patient during sleep study : No sleep medicine administered.  IMPRESSIONS - Mean sleep latency was 1 second. - He achieved sleep onset in 5 out of 5 nap sessions.   - He achieved sleep onset REM periods (SOREM) in 4 out of 5 nap sessions.  DIAGNOSIS - Narcolepsy.  RECOMMENDATIONS - Return to sleep clinic for further management of narcolepsy.  [Electronically signed] 11/14/2018 04:32 PM  Chesley Mires MD, [place], American Board of Sleep Medicine   NPI: [id number]

## 2018-11-14 NOTE — Telephone Encounter (Signed)
ATC pt, no answer. Left message for pt to call back.  

## 2018-11-15 NOTE — Telephone Encounter (Signed)
Called the patient and made him aware of the result information from Dr. Halford Chessman. That voiced understanding. Patient set up appointment to see Dr. Halford Chessman to discuss the results on 11/27/18 at 9:15. Nothing further needed at this time.

## 2018-11-27 ENCOUNTER — Ambulatory Visit: Payer: BC Managed Care – PPO | Admitting: Pulmonary Disease

## 2018-11-30 ENCOUNTER — Ambulatory Visit (INDEPENDENT_AMBULATORY_CARE_PROVIDER_SITE_OTHER): Payer: BC Managed Care – PPO | Admitting: Pulmonary Disease

## 2018-11-30 ENCOUNTER — Other Ambulatory Visit: Payer: Self-pay

## 2018-11-30 ENCOUNTER — Encounter: Payer: Self-pay | Admitting: Pulmonary Disease

## 2018-11-30 VITALS — BP 122/70 | HR 72 | Temp 98.3°F | Ht 66.0 in | Wt 153.6 lb

## 2018-11-30 DIAGNOSIS — G47411 Narcolepsy with cataplexy: Secondary | ICD-10-CM | POA: Diagnosis not present

## 2018-11-30 MED ORDER — ARMODAFINIL 150 MG PO TABS: 150.0000 mg | ORAL_TABLET | Freq: Every day | ORAL | 5 refills | Status: DC

## 2018-11-30 NOTE — Progress Notes (Signed)
[facility]  Chief Complaint  Patient presents with  . Follow-up    Discuss sleep study results.    Constitutional:  BP 122/70 (BP Location: Right Arm, Patient Position: Sitting, Cuff Size: Normal)   Pulse 72   Temp 98.3 F (36.8 C)   Ht 5\' 6"  (1.676 m)   Wt 153 lb 9.6 oz (69.7 kg)   SpO2 100%   BMI 24.79 kg/m   Past Medical History:  PSTD, Mandibular fracture  Brief Summary:  Oscar Mercado is a 38 y.o. 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  He is here to review his sleep studies.  These are consistent with narcolepsy.  He has typical description of episodes related to cataplexy.  Physical Exam:   Appearance - well kempt   ENMT - clear nasal mucosa, midline nasal  septum, no oral exudates, no LAN, trachea midline  Respiratory - normal chest wall, normal respiratory effort, no accessory muscle use, no wheeze/rales  CV - s1s2 regular rate and rhythm, no murmurs, no peripheral edema, radial pulses symmetric  GI - soft, non tender, no masses  Lymph - no adenopathy noted in neck and axillary areas  MSK - normal gait  Ext - no cyanosis, clubbing, or joint inflammation noted  Skin - no rashes, lesions, or ulcers  Neuro - normal strength, oriented x 3  Psych - normal mood and affect   Assessment/Plan:   Narcolepsy with cataplexy. 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 - advised him that letter can be written by me for his employer if needed to ensure that he has work place accommodations in relation to diagnosis of narcolepsy   Patient Instructions  Armodafinil 150 mg pill - take shortly after you wake up to start your day  Try scheduling naps for 15  to 20 minutes a couple times per day as able  Maintain a regular sleep time and wake up time as best as you are able  Follow up in 4 weeks - follow up can be televisit if you prefer  A total of  29 minutes were spent face to face with the patient and more than half of that time involved counseling or coordination of care.   Chesley Mires, MD [place] Pager: [phone] 11/30/2018, 12:19 PM  Flow Sheet      Sleep tests:  PSG 11/06/18 >> SO 0.4 minutes, total sleep time 394.5 minutes, frequent REM with fragmented sleep, AHI 0.6, SpO2 low 89%, PLMI 0 MSLT 11/08/18 >> 5/5 naps with sleep, mean sleep latency 1 second, 4/5 naps with SOREM  Medications:   Allergies as of 11/30/2018   No Known Allergies     Medication List       Accurate as of November 30, 2018 12:19 PM. If you have any questions, ask your nurse or doctor.        STOP taking these medications   erythromycin ophthalmic ointment Stopped by: Chesley Mires, MD   lidocaine 5 % Commonly known as: Lidoderm Stopped by: Chesley Mires, MD   methocarbamol 500 MG tablet Commonly known as: ROBAXIN Stopped by: Chesley Mires, MD   naproxen 500 MG tablet Commonly known as: NAPROSYN Stopped by: Chesley Mires, MD  TAKE these medications   Armodafinil 150 MG tablet Take 1 tablet (150 mg total) by mouth daily. Started by: Coralyn Helling, MD   ketorolac 10 MG tablet Commonly known as: TORADOL TAKE 1 TABLET BY MOUTH 4 TIMES DAILY       Past Surgical History:  He  has a past surgical history that includes ORIF mandibular fracture.  Family History:  His family history includes Diabetes in his maternal grandmother.  Social History:  He  reports that he has been smoking cigars. He has never used smokeless tobacco. He reports current alcohol use. He reports current drug use. Drug: Marijuana.

## 2018-11-30 NOTE — Patient Instructions (Signed)
Armodafinil 150 mg pill - take shortly after you wake up to start your day  Try scheduling naps for 15 to 20 minutes a couple times per day as able  Maintain a regular sleep time and wake up time as best as you are able  Follow up in 4 weeks - follow up can be televisit if you prefer

## 2018-12-08 ENCOUNTER — Encounter: Payer: Self-pay | Admitting: General Surgery

## 2018-12-29 ENCOUNTER — Ambulatory Visit (INDEPENDENT_AMBULATORY_CARE_PROVIDER_SITE_OTHER): Payer: BC Managed Care – PPO | Admitting: Pulmonary Disease

## 2018-12-29 ENCOUNTER — Encounter: Payer: Self-pay | Admitting: Pulmonary Disease

## 2018-12-29 ENCOUNTER — Other Ambulatory Visit: Payer: Self-pay

## 2018-12-29 VITALS — BP 124/72 | HR 70 | Temp 97.3°F | Ht 66.0 in | Wt 153.0 lb

## 2018-12-29 DIAGNOSIS — G47411 Narcolepsy with cataplexy: Secondary | ICD-10-CM

## 2018-12-29 MED ORDER — VENLAFAXINE HCL 37.5 MG PO TABS: 37.5000 mg | ORAL_TABLET | Freq: Every day | ORAL | 5 refills | Status: DC

## 2018-12-29 NOTE — Patient Instructions (Signed)
Venlafaxine 37.5 mg daily to help with symptoms of cataplexy  Armodafinil 150 mg daily to help with maintaining alertness  Can try melatonin or other over the counter sleep aid to help with sleep consolidation  Maintain a regular sleep time and wake time as best as possible  Try to allow for 15 to 20 minutes once or twice per day  Follow up in 2 months

## 2018-12-29 NOTE — Progress Notes (Signed)
[facility]  Chief Complaint  Patient presents with  . Narcolepsy and cataplexy    No improvement since last visit.    Constitutional:  BP 124/72 (BP Location: Left Arm, Patient Position: Sitting, Cuff Size: Normal)   Pulse 70   Temp (!) 97.3 F (36.3 C)   Ht 5\' 6"  (1.676 m)   Wt 153 lb (69.4 kg)   SpO2 99% Comment: on room air  BMI 24.69 kg/m   Past Medical History:  PSTD, Mandibular fracture  Brief Summary:  Oscar Mercado is a 38 y.o. 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  He would like to find out about alternative approaches to treating narcolepsy.  He doesn't want to feel like he is committed to medications for the rest of his life.  He also wants to have his issues with cataplexy addressed sooner.  He hasn't started armodafinil yet.  Physical Exam:   Deferred.   Assessment/Plan:   Narcolepsy with cataplexy. - discussed issues that general need to be addressed in treating narcolepsy with cataplexy: 1) daytime sleepiness, 2) cataplexy, 3) sleep disruption - reviewed maintaining regular sleep/wake schedule - advised to take a nap for 15 to 20 minutes one or two times per day if able - advised him to start armodafinil to help promote wakefulness - will add venlafaxine 37.5 mg daily for prevention of cataplexy and titrate dose up as needed up to 150 mg daily - advised him that he could try OCT herbal supplements to help promote sleep consolidation - he would like to investigate other lifestyle modifications that could impact management of his narcolepsy with cataplexy (atkins diet, supplements, vitamins, etc)  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 - advised him that letter can be written by me for his employer if needed to ensure that he has work place accommodations in relation to diagnosis of narcolepsy   Patient Instructions  Venlafaxine 37.5 mg daily to help with symptoms of cataplexy  Armodafinil 150 mg daily to help  with maintaining alertness  Can try melatonin or other over the counter sleep aid to help with sleep consolidation  Maintain a regular sleep time and wake time as best as possible  Try to allow for 15 to 20 minutes once or twice per day  Follow up in 2 months   A total of  23 minutes were spent face to face with the patient and more than half of that time involved counseling or coordination of care.   Chesley Mires, MD [place] Pager: [phone] 12/29/2018, 1:20 PM  Flow Sheet      Sleep tests:  PSG 11/06/18 >> SO 0.4 minutes, total sleep time 394.5 minutes, frequent REM with fragmented sleep, AHI 0.6, SpO2 low 89%, PLMI 0 MSLT 11/08/18 >> 5/5 naps with sleep, mean sleep latency 1 second, 4/5 naps with SOREM  Medications:   Allergies as of 12/29/2018   No Known Allergies     Medication List       Accurate as of December 29, 2018  1:20 PM. If you have any questions, ask your nurse or doctor.        Armodafinil 150 MG tablet Take 1 tablet (150 mg total) by mouth daily.   ketorolac 10 MG tablet Commonly known as: TORADOL TAKE 1 TABLET BY MOUTH 4 TIMES DAILY   venlafaxine 37.5 MG tablet Commonly known as: EFFEXOR Take 1 tablet (37.5 mg total) by mouth daily. Started by: Chesley Mires, MD  Past Surgical History:  He  has a past surgical history that includes ORIF mandibular fracture.  Family History:  His family history includes Diabetes in his maternal grandmother.  Social History:  He  reports that he has been smoking cigars. He has never used smokeless tobacco. He reports current alcohol use. He reports current drug use. Drug: Marijuana.

## 2019-02-28 ENCOUNTER — Ambulatory Visit: Payer: BC Managed Care – PPO | Admitting: Pulmonary Disease

## 2019-11-02 ENCOUNTER — Other Ambulatory Visit: Payer: Self-pay

## 2019-11-02 ENCOUNTER — Other Ambulatory Visit: Payer: BC Managed Care – PPO

## 2019-11-02 DIAGNOSIS — Z20822 Contact with and (suspected) exposure to covid-19: Secondary | ICD-10-CM

## 2019-11-04 LAB — NOVEL CORONAVIRUS, NAA: SARS-CoV-2, NAA: NOT DETECTED

## 2019-11-04 LAB — SARS-COV-2, NAA 2 DAY TAT

## 2019-11-13 ENCOUNTER — Other Ambulatory Visit: Payer: BC Managed Care – PPO

## 2019-11-21 ENCOUNTER — Other Ambulatory Visit: Payer: BC Managed Care – PPO

## 2019-11-21 ENCOUNTER — Other Ambulatory Visit: Payer: Self-pay

## 2019-11-21 DIAGNOSIS — Z20822 Contact with and (suspected) exposure to covid-19: Secondary | ICD-10-CM

## 2019-11-22 LAB — NOVEL CORONAVIRUS, NAA: SARS-CoV-2, NAA: NOT DETECTED

## 2019-11-22 LAB — SARS-COV-2, NAA 2 DAY TAT

## 2019-12-14 ENCOUNTER — Other Ambulatory Visit: Payer: BC Managed Care – PPO

## 2020-05-05 ENCOUNTER — Other Ambulatory Visit: Payer: Self-pay

## 2020-05-06 ENCOUNTER — Ambulatory Visit (INDEPENDENT_AMBULATORY_CARE_PROVIDER_SITE_OTHER): Payer: BC Managed Care – PPO | Admitting: Adult Health

## 2020-05-06 ENCOUNTER — Ambulatory Visit (INDEPENDENT_AMBULATORY_CARE_PROVIDER_SITE_OTHER): Payer: BC Managed Care – PPO

## 2020-05-06 VITALS — BP 114/74 | HR 88 | Temp 98.5°F | Resp 18 | Wt 152.4 lb

## 2020-05-06 DIAGNOSIS — M545 Low back pain, unspecified: Secondary | ICD-10-CM

## 2020-05-06 DIAGNOSIS — G8929 Other chronic pain: Secondary | ICD-10-CM

## 2020-05-06 IMAGING — DX DG HIP (WITH OR WITHOUT PELVIS) 2-3V*L*
2 series · 2 of 2 positions shown · non-contrast
Comparison: [DATE].

CLINICAL DATA: Left hip pain.

EXAM:
DG HIP (WITH OR WITHOUT PELVIS) 2-3V LEFT

[pelvis ap]
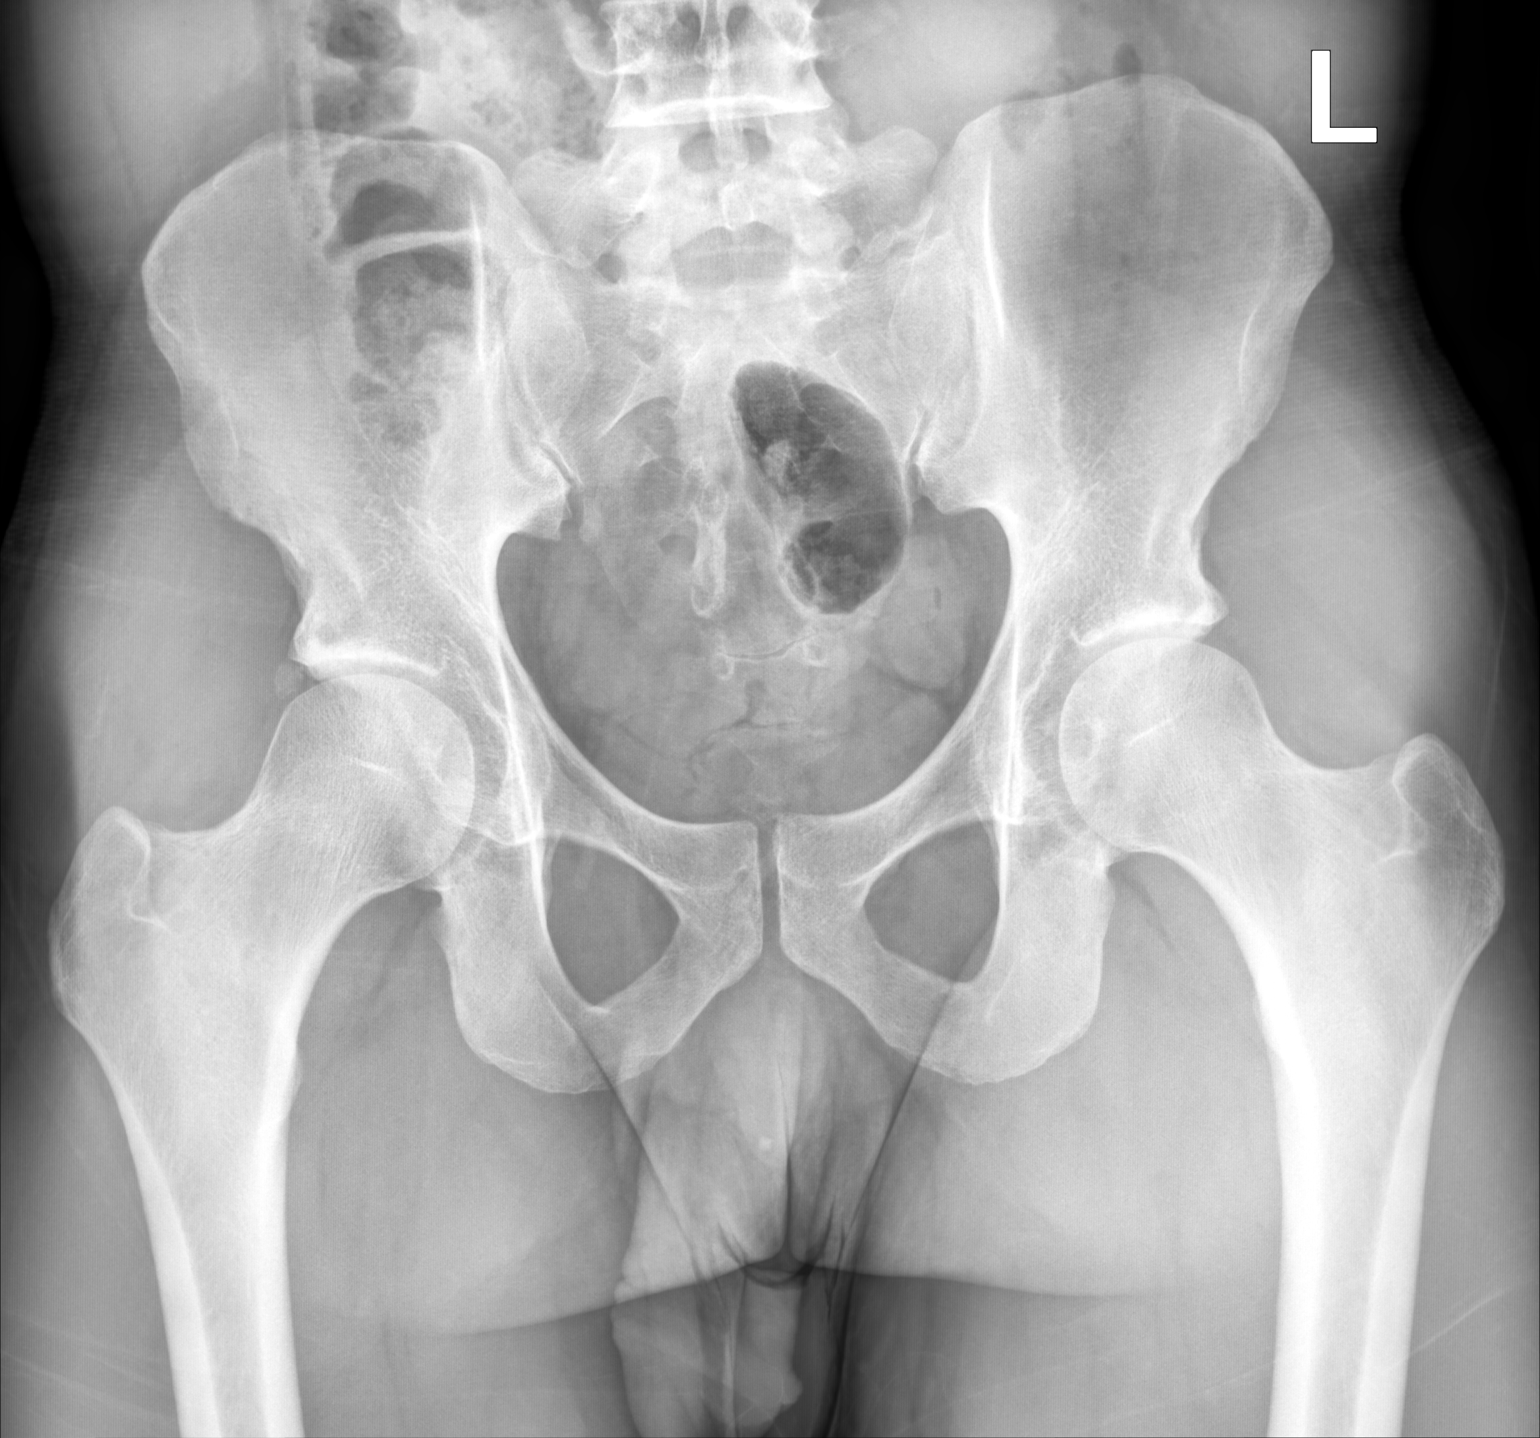

[hip (frog leg) lat]
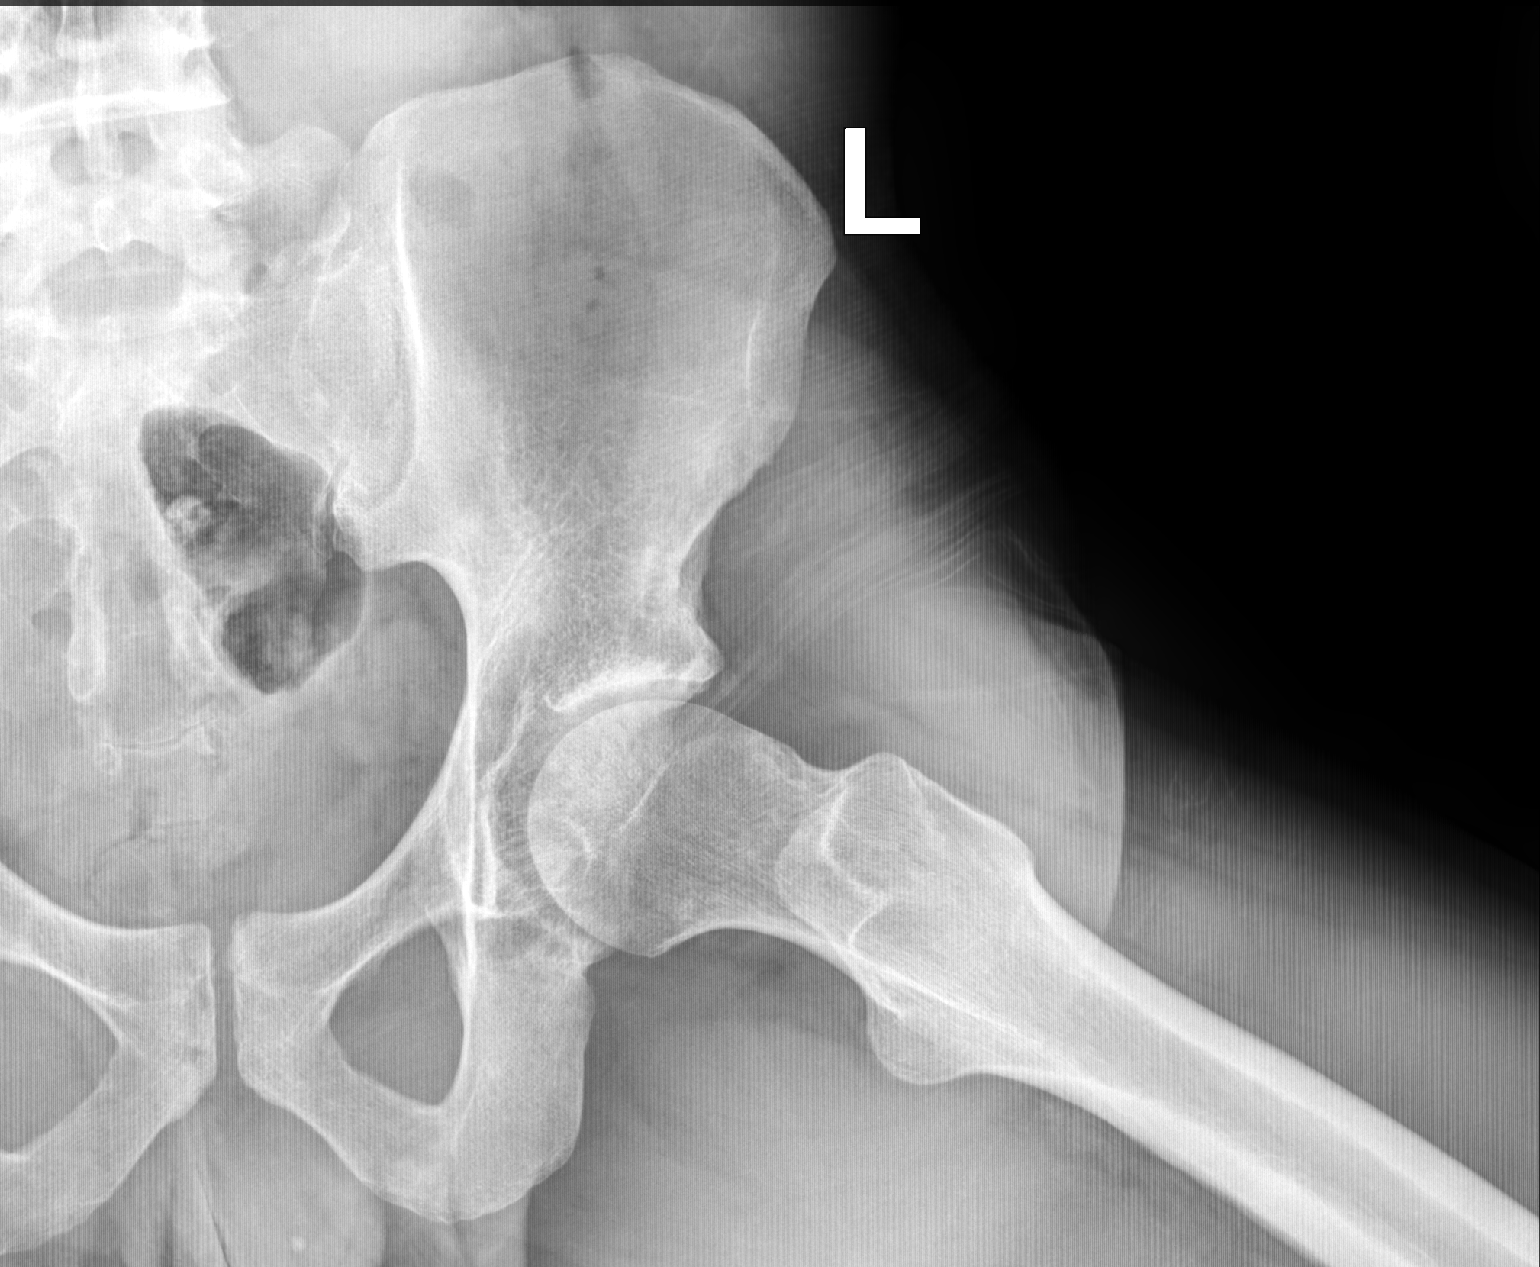

[2 of 2 positions shown; findings below may reference images not displayed]

FINDINGS: Degenerative changes both hips. Small corticated bony densities
noted adjacent to both acetabuli most likely related to degenerative
change and or prior injury. No evidence of acute fracture or
dislocation. Peroneal calcification consistent phlebolith noted.
IMPRESSION: Degenerative changes both hips. Corticated bony densities are noted
adjacent to both acetabulum most likely related to degenerative
change and or prior injury. No evidence of acute fracture or
dislocation.

## 2020-05-06 IMAGING — DX DG LUMBAR SPINE COMPLETE 4+V
5 series · 5 of 5 positions shown · non-contrast
Comparison: [DATE].

CLINICAL DATA: Lumbar pain.

EXAM:
LUMBAR SPINE - COMPLETE 4+ VIEW

[lumbar spine ap]
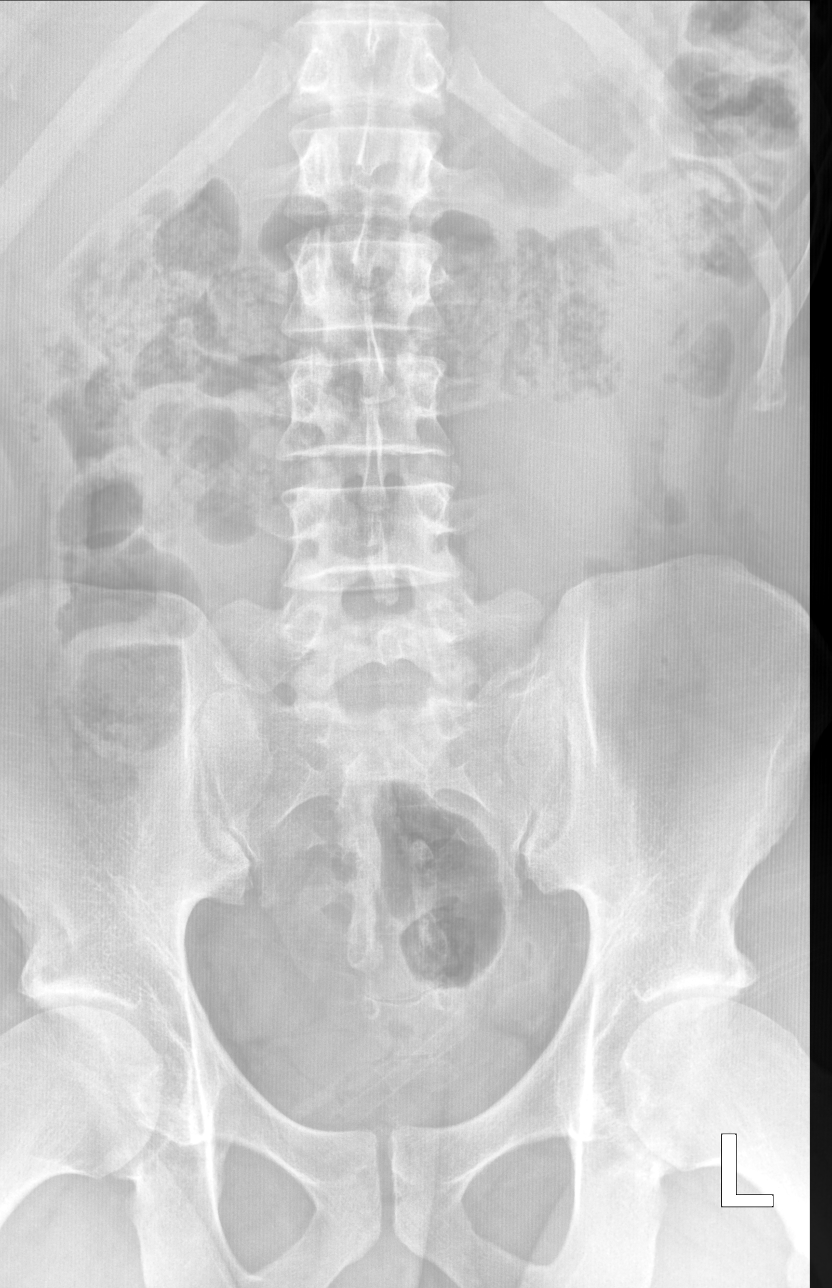

[lumbar spine oblique (1 of 2)]
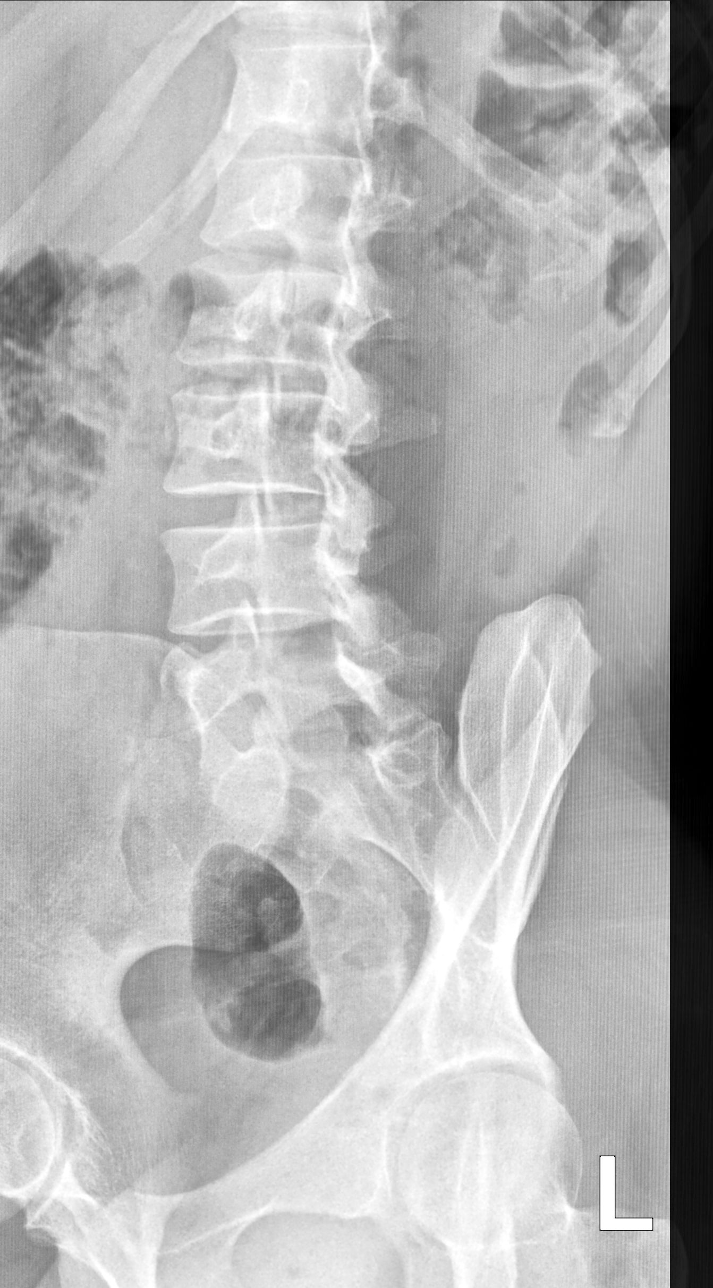

[lumbar spine oblique (2 of 2)]
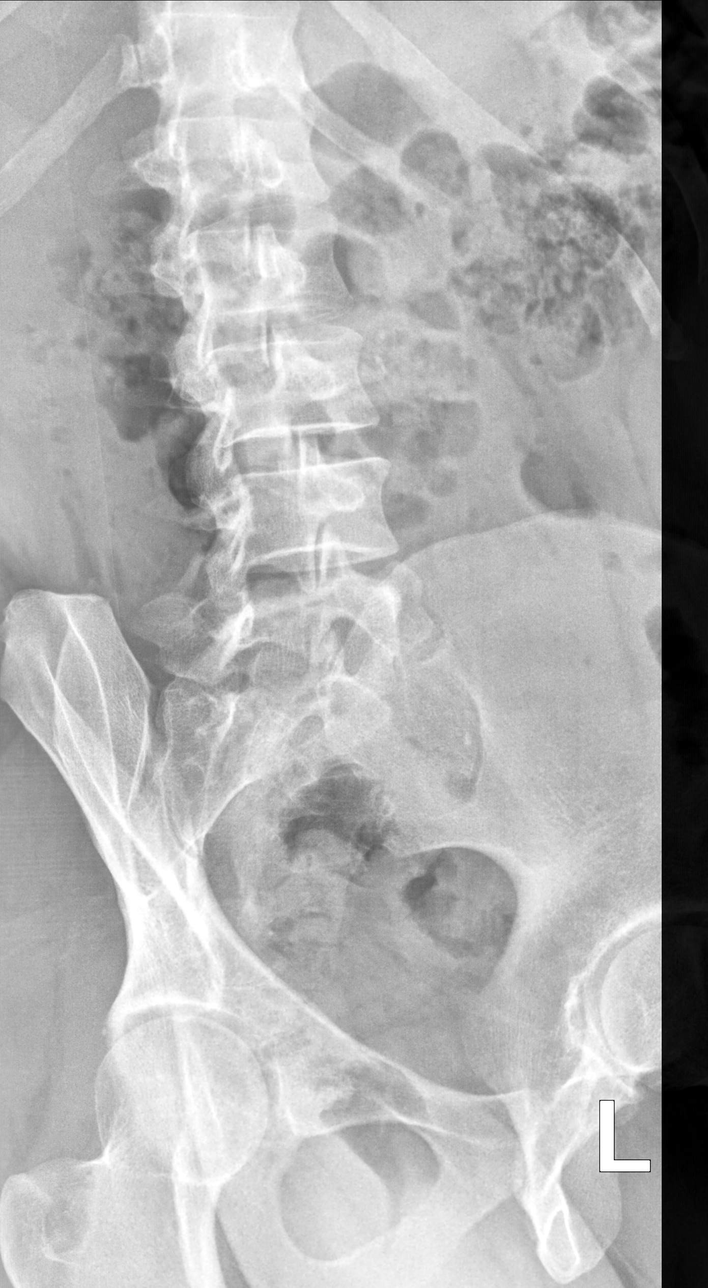

[lumbar spine lat]
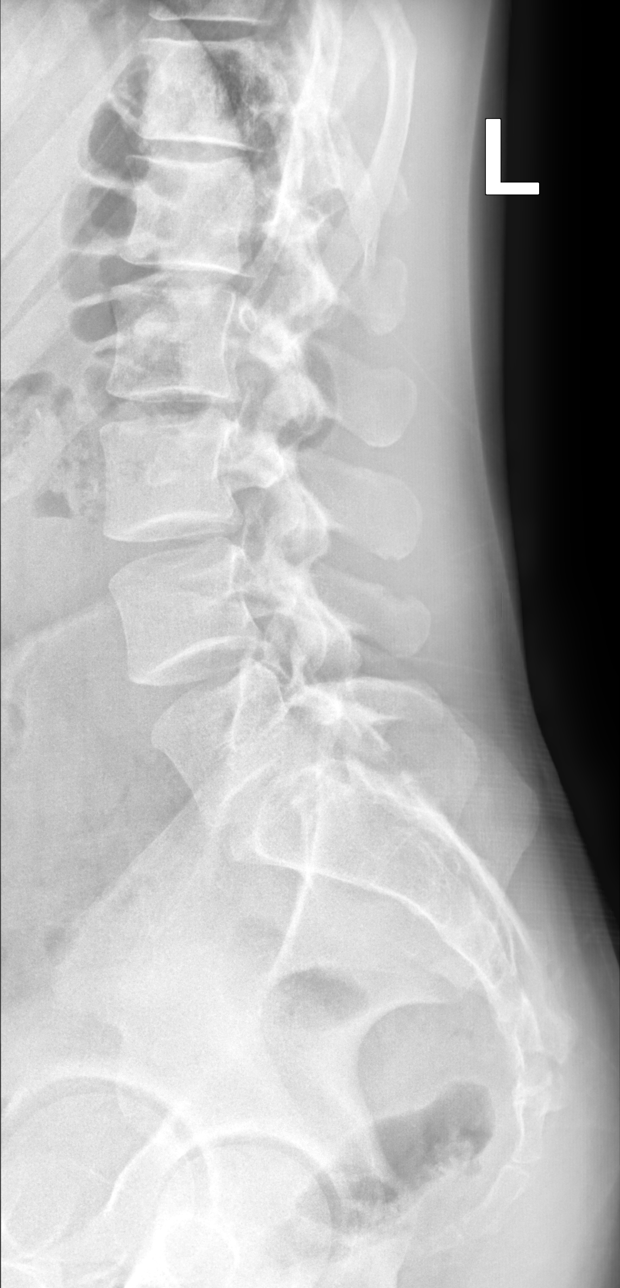

[lumbar spine lat spot]
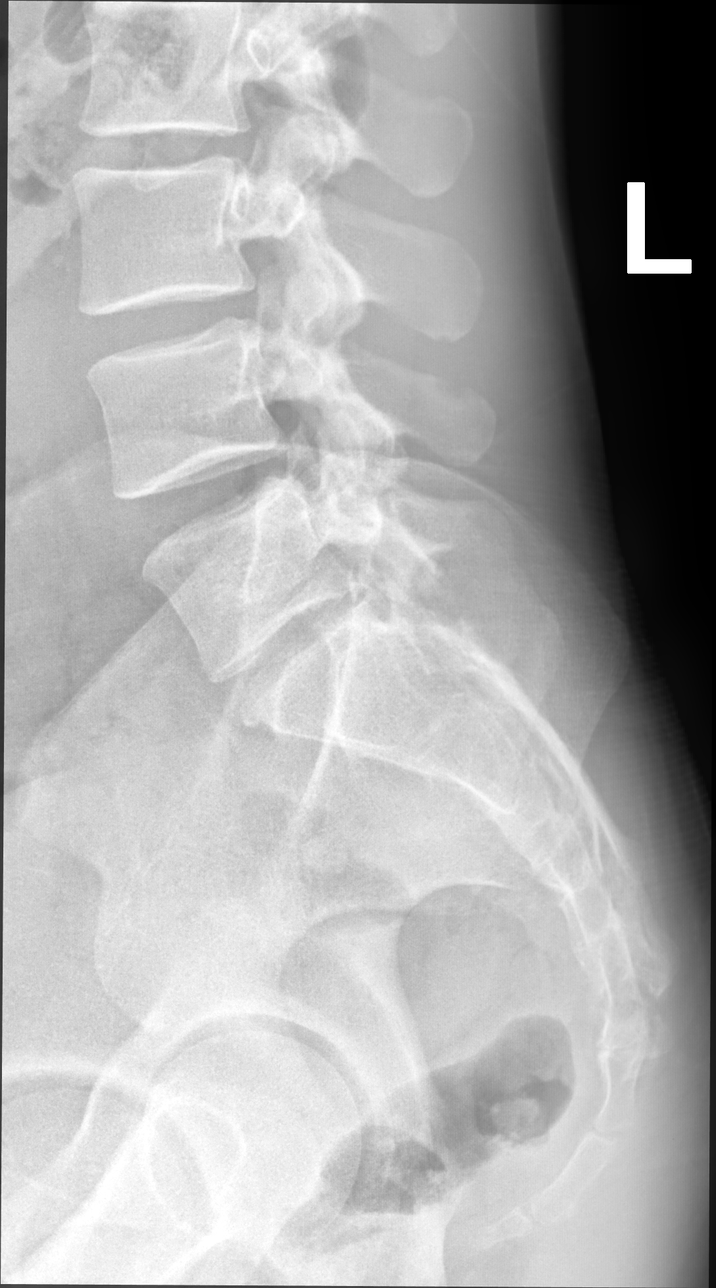

[5 of 5 positions shown; findings below may reference images not displayed]

FINDINGS: Mild scoliosis concave left. No acute bony abnormality identified.
No evidence of fracture. Paraspinal soft tissues are unremarkable.
IMPRESSION: Mild scoliosis concave left. No acute abnormality.

## 2020-05-06 MED ORDER — METHYLPREDNISOLONE 4 MG PO TBPK: ORAL_TABLET | ORAL | 0 refills | Status: DC

## 2020-05-06 MED ORDER — MELOXICAM 15 MG PO TABS: 15.0000 mg | ORAL_TABLET | Freq: Every day | ORAL | 0 refills | Status: DC
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
## 2020-05-06 NOTE — Patient Instructions (Signed)
Today I am going to get xrays of of your low back and left hip   I am also going to prescribe you a steroid pack and meloxicam   I will follow up with you regarding the xrays, likely we will need to get an MRI

## 2020-05-07 ENCOUNTER — Telehealth: Payer: Self-pay | Admitting: Adult Health

## 2020-05-07 DIAGNOSIS — M545 Low back pain, unspecified: Secondary | ICD-10-CM

## 2020-05-07 NOTE — Telephone Encounter (Signed)
Spoke to patient and informed of Xrays.  Rays show mild scoliosis of the lumbar spine and some degenerative changes in his pelvis.  Will order MRI.  Consider referral to orthopedics and/or physical therapy once MRI has resulted  FMLA paperwork filled out and faxed

## 2020-05-08 NOTE — Telephone Encounter (Signed)
Spoke with the pt and informed him the FMLA paperwork was faxed to [employer] at [phone] and sent to be scanned.

## 2020-05-20 ENCOUNTER — Telehealth: Payer: Self-pay | Admitting: Adult Health

## 2020-05-20 NOTE — Telephone Encounter (Signed)
Patient is calling and stated that he is getting an MRI on Saturday but have been experiencing pain in his neck for a couple of weeks. Pt wanted to see if the order can be undated to look at his neck also, please advise. CB is [phone]

## 2020-05-21 ENCOUNTER — Other Ambulatory Visit: Payer: Self-pay | Admitting: Adult Health

## 2020-05-21 DIAGNOSIS — M542 Cervicalgia: Secondary | ICD-10-CM

## 2020-05-21 NOTE — Telephone Encounter (Signed)
Spoke with the pt and informed him of the message below.  Also advised the pt to contact the facility in which he is already has been scheduled to check a time for the additional test.

## 2020-05-21 NOTE — Telephone Encounter (Signed)
Will add order for MR of cervical spine.

## 2020-05-24 ENCOUNTER — Other Ambulatory Visit: Payer: Self-pay

## 2020-05-24 ENCOUNTER — Ambulatory Visit
Admission: RE | Admit: 2020-05-24 | Discharge: 2020-05-24 | Disposition: A | Discharge status: Home / Self Care | Payer: BC Managed Care – PPO | Source: Ambulatory Visit | Attending: Adult Health | Admitting: Adult Health

## 2020-05-24 DIAGNOSIS — G8929 Other chronic pain: Secondary | ICD-10-CM

## 2020-05-24 DIAGNOSIS — M545 Low back pain, unspecified: Secondary | ICD-10-CM

## 2020-05-24 IMAGING — MR MR LUMBAR SPINE W/O CM
5 series · 48 of 48 positions shown · non-contrast
Comparison: Prior radiograph from [DATE].

CLINICAL DATA: Initial evaluation for low back pain with bilateral
leg, hip, and buttock pain. Worse on the left.

EXAM:
MRI LUMBAR SPINE WITHOUT CONTRAST
TECHNIQUE: Multiplanar, multisequence MR imaging of the lumbar spine was
performed. No intravenous contrast was administered.

[Series 3: T2 · sagittal · 4.0mm · 0.88mm/px · 6 of 12 slices shown (1 of 2)]
[im 1/12]
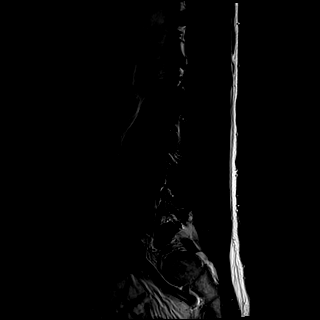
[im 3/12]
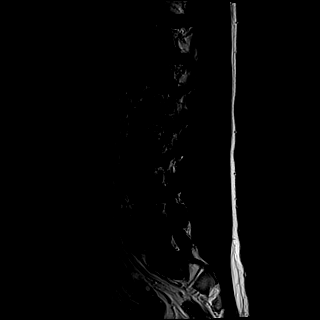
[im 5/12]
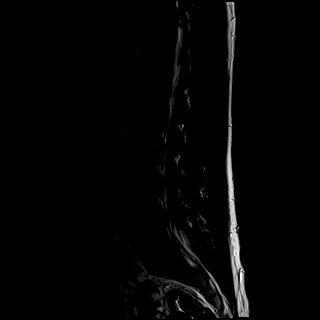
[im 7/12]
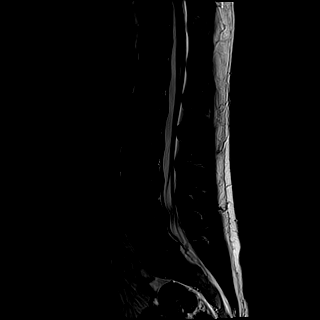
[im 9/12]
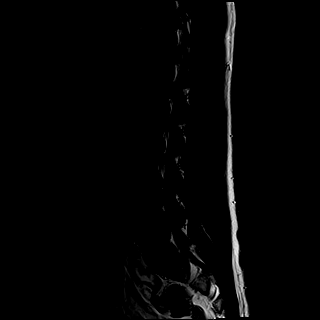
[im 12/12]
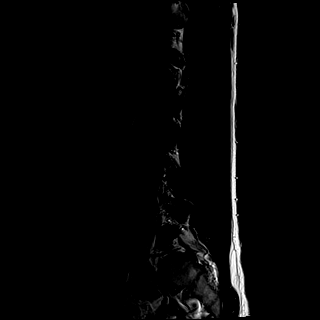

[Series 4: tirm sag · sagittal · 4.0mm · 0.88mm/px · 5 of 12 slices shown]
[im 1/12]
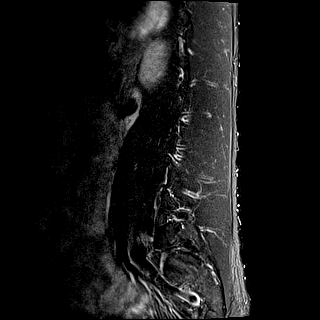
[im 3/12]
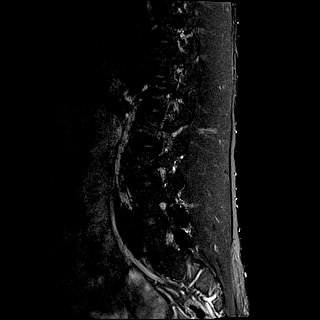
[im 6/12]
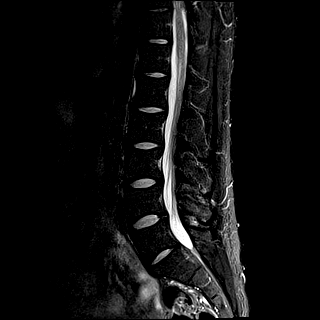
[im 9/12]
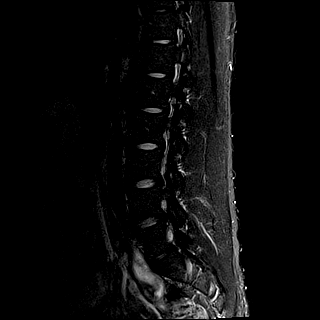
[im 12/12]
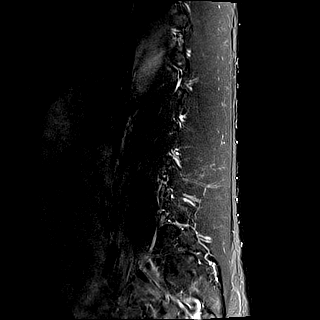

[Series 5: T1 · sagittal · 4.0mm · 0.88mm/px · 5 of 12 slices shown (1 of 2)]
[im 1/12]
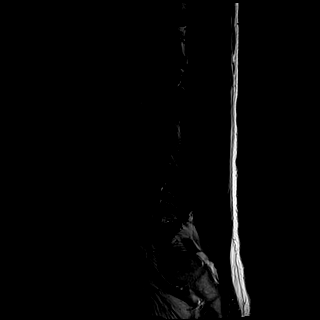
[im 3/12]
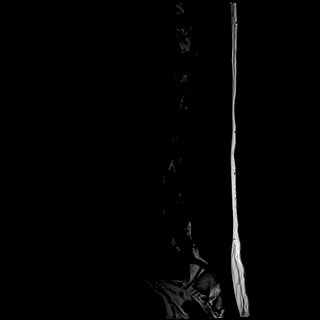
[im 6/12]
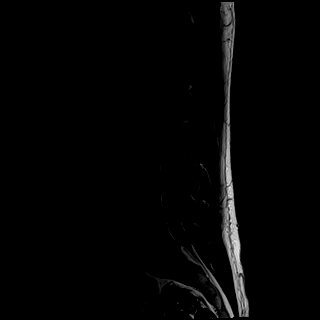
[im 9/12]
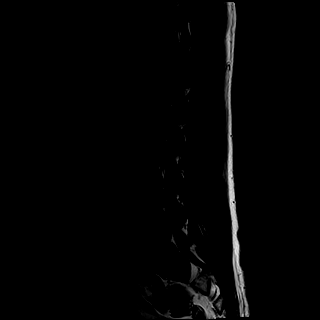
[im 12/12]
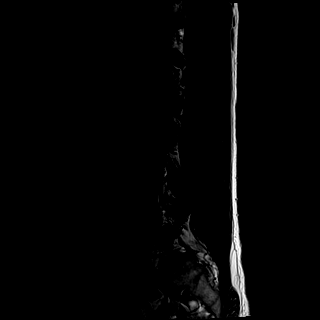

[Series 6: T2 · axial · 4.0mm · 0.70mm/px · z∈[-40,+162]mm · 16 of 38 slices shown (2 of 2)]
[im 1/38]
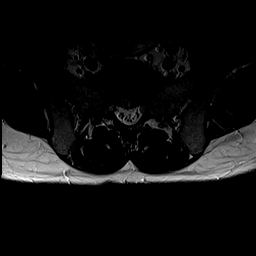
[im 3/38]
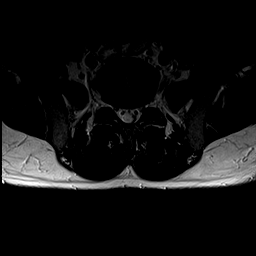
[im 5/38]
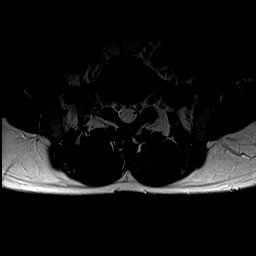
[im 8/38]
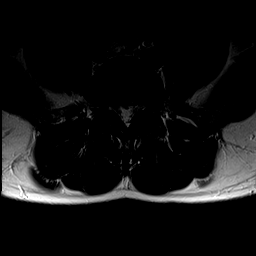
[im 10/38]
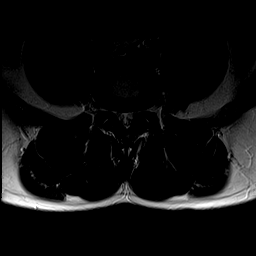
[im 13/38]
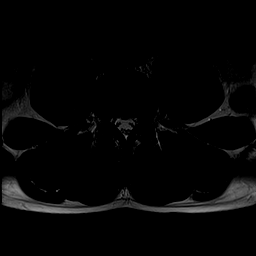
[im 15/38]
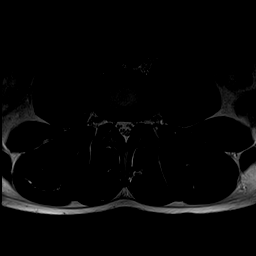
[im 18/38]
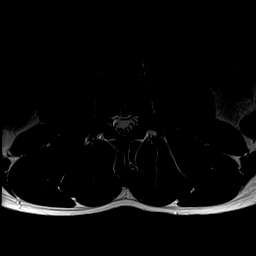
[im 20/38]
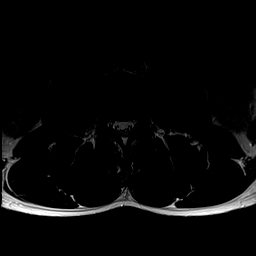
[im 23/38]
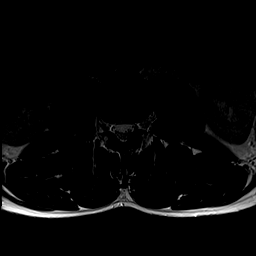
[im 25/38]
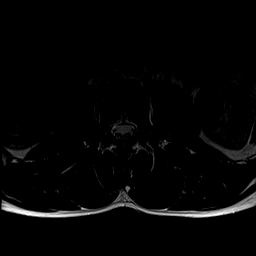
[im 28/38]
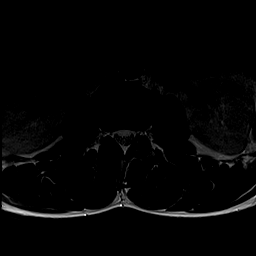
[im 30/38]
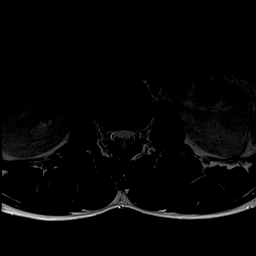
[im 33/38]
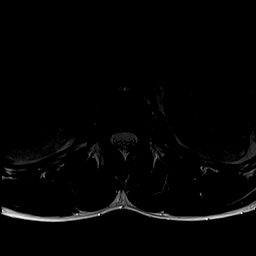
[im 35/38]
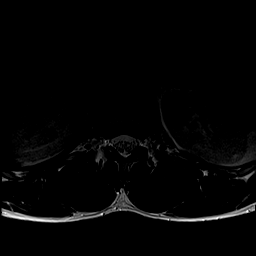
[im 38/38]
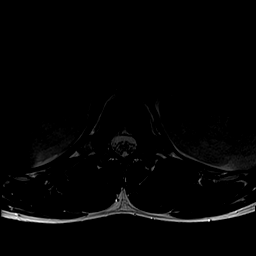

[Series 7: T1 · axial · 4.0mm · 0.70mm/px · z∈[-40,+162]mm · 16 of 38 slices shown (2 of 2)]
[im 1/38]
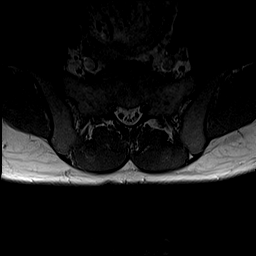
[im 3/38]
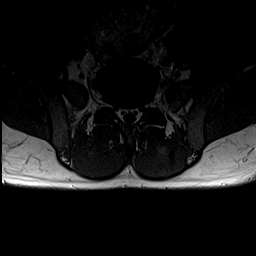
[im 5/38]
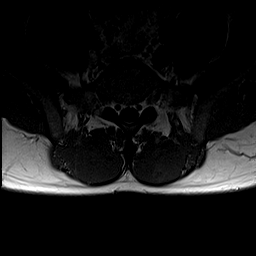
[im 8/38]
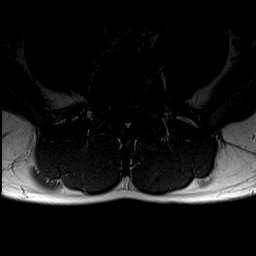
[im 10/38]
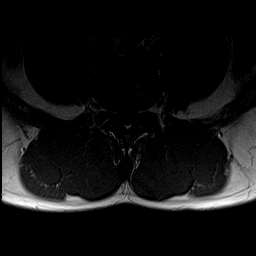
[im 13/38]
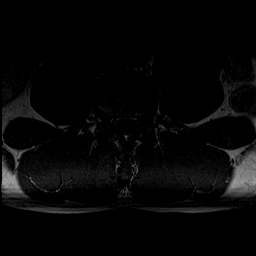
[im 15/38]
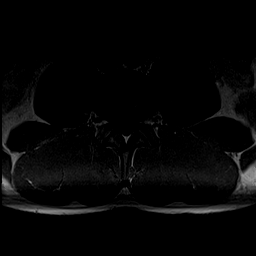
[im 18/38]
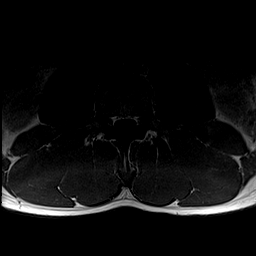
[im 20/38]
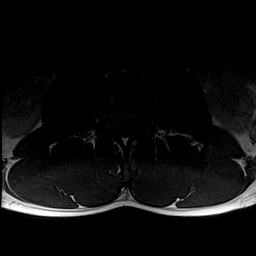
[im 23/38]
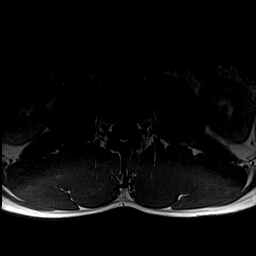
[im 25/38]
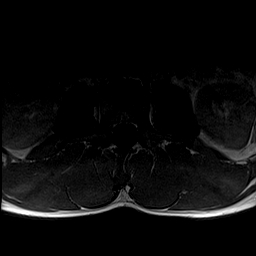
[im 28/38]
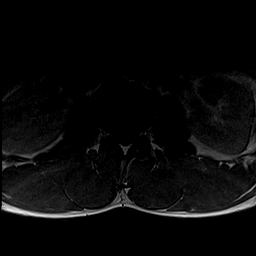
[im 30/38]
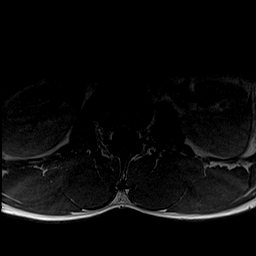
[im 33/38]
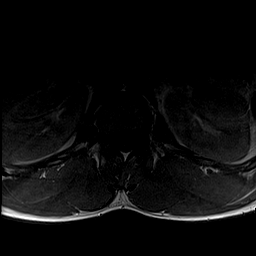
[im 35/38]
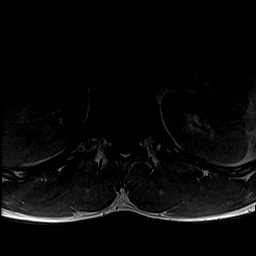
[im 38/38]
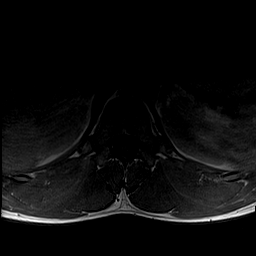

[48 of 48 positions shown; findings below may reference images not displayed]

FINDINGS: Segmentation: Standard. Lowest well-formed disc space labeled the
L5-S1 level.

Alignment: Trace dextroscoliosis. Alignment otherwise normal with
preservation of the normal lumbar lordosis. No listhesis.

Vertebrae: Vertebral body height maintained without acute or chronic
fracture. Bone marrow signal intensity within normal limits. No
discrete or worrisome osseous lesions. No abnormal marrow edema.

Conus medullaris and cauda equina: Conus extends to the T12 level.
Conus and cauda equina appear normal.

Paraspinal and other soft tissues: Unremarkable.

Disc levels:

L1-2:  Unremarkable.

L2-3:  Unremarkable.

L3-4:  Unremarkable.

L4-5: Mild broad-based posterior disc bulge, slightly asymmetric to
the right. Mild facet hypertrophy. Resultant mild narrowing of the
lateral recesses bilaterally. Central canal remains patent. No
significant foraminal stenosis.

L5-S1:  Unremarkable.
IMPRESSION: 1. Mild broad-based posterior disc bulge and facet hypertrophy at
L4-5 with resultant mild bilateral lateral recess stenosis. No frank
neural impingement.
2. Otherwise essentially normal MRI of the lumbar spine.

## 2020-05-24 IMAGING — MR MR HIP*L* W/O CM
4 of 5 series · 31 of 40 positions shown · non-contrast
Comparison: Radiographs [DATE]

CLINICAL DATA: Chronic left hip pain

EXAM:
MR OF THE LEFT HIP WITHOUT CONTRAST
TECHNIQUE: Multiplanar, multisequence MR imaging was performed. No intravenous
contrast was administered.

[Series 3: T1 · coronal · 4.0mm · 1.19mm/px · 9 of 24 slices shown]
[im 1/24]
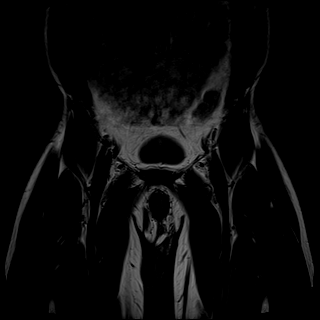
[im 3/24]
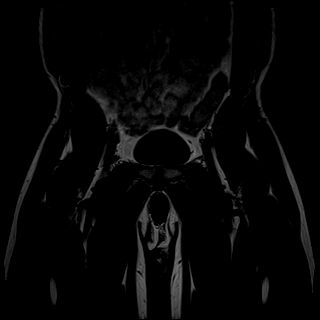
[im 6/24]
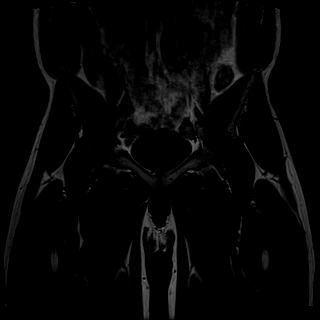
[im 9/24]
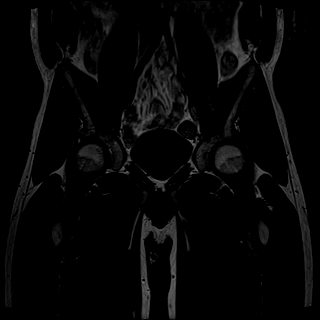
[im 12/24]
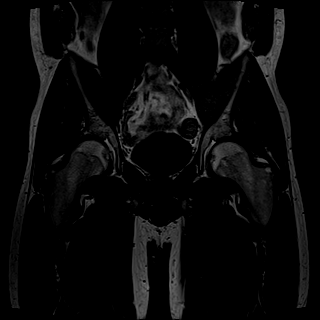
[im 15/24]
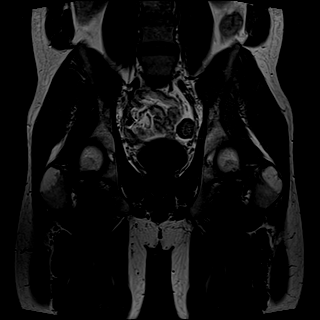
[im 18/24]
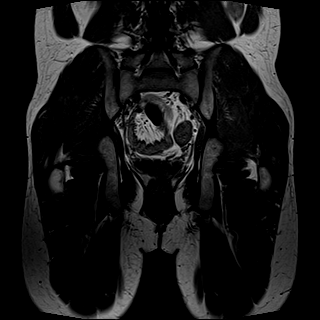
[im 21/24]
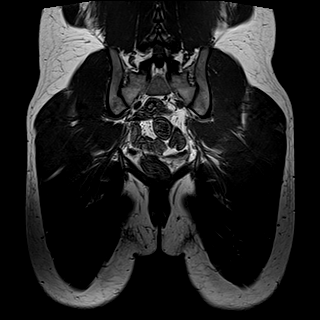
[im 24/24]
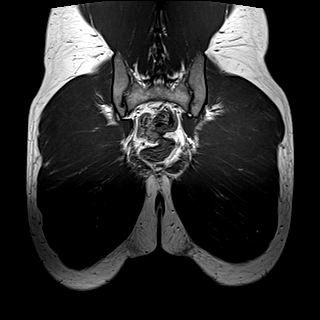

[Series 4: T2 fat-sat · coronal · 4.0mm · 1.19mm/px · 8 of 24 slices shown (1 of 2)]
[im 1/24]
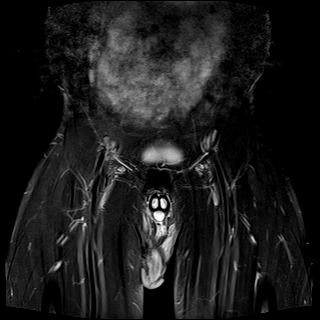
[im 4/24]
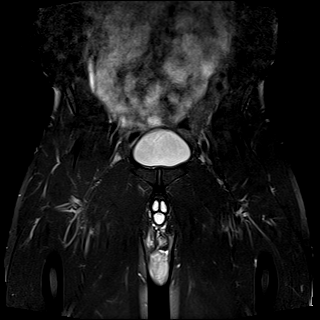
[im 7/24]
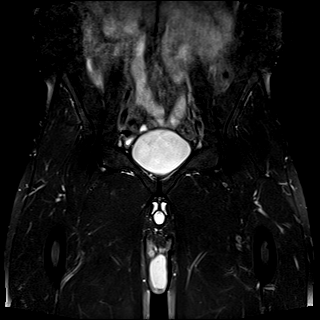
[im 10/24]
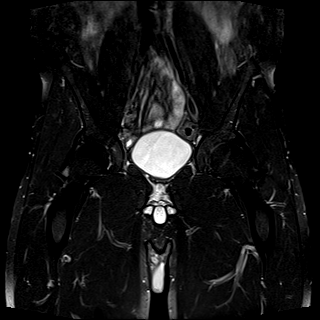
[im 14/24]
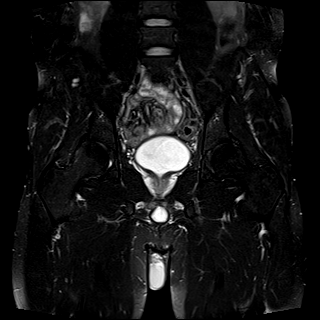
[im 17/24]
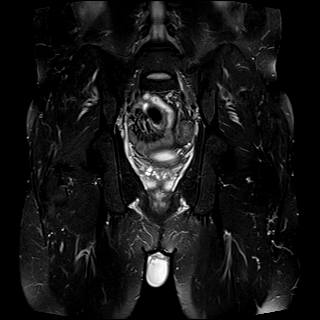
[im 20/24]
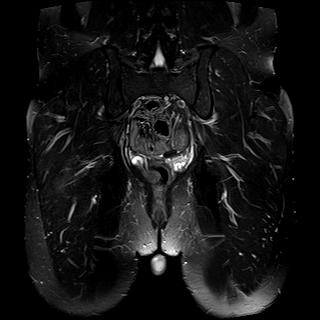
[im 24/24]
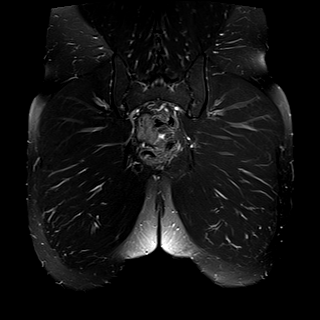

[Series 5: T2 fat-sat · axial · 4.0mm · 0.62mm/px · z∈[-23,+97]mm · 9 of 26 slices shown (2 of 2)]
[im 1/26]
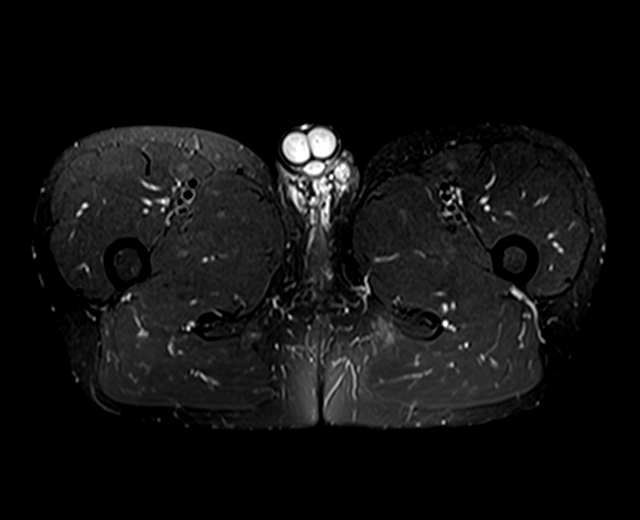
[im 4/26]
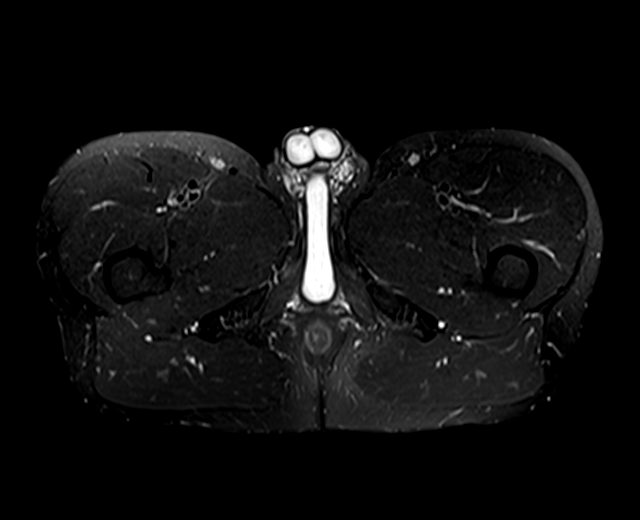
[im 7/26]
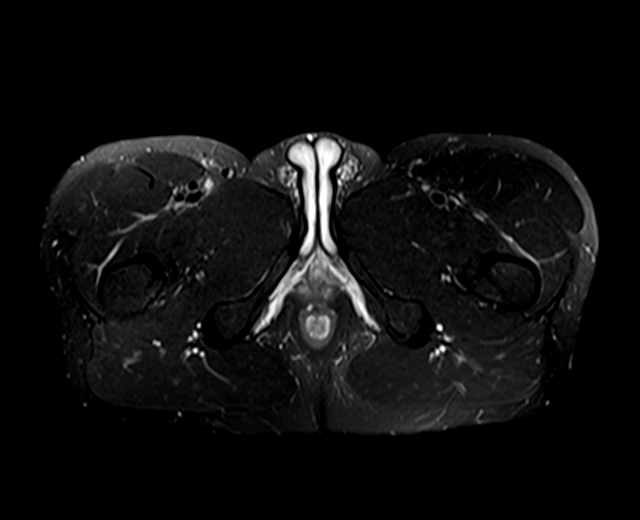
[im 10/26]
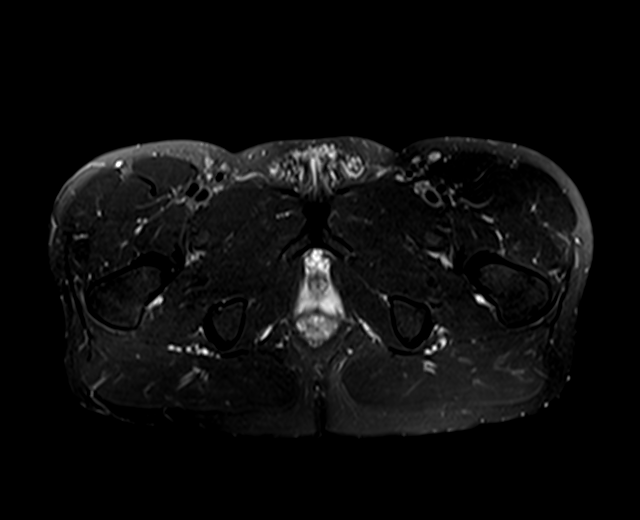
[im 13/26]
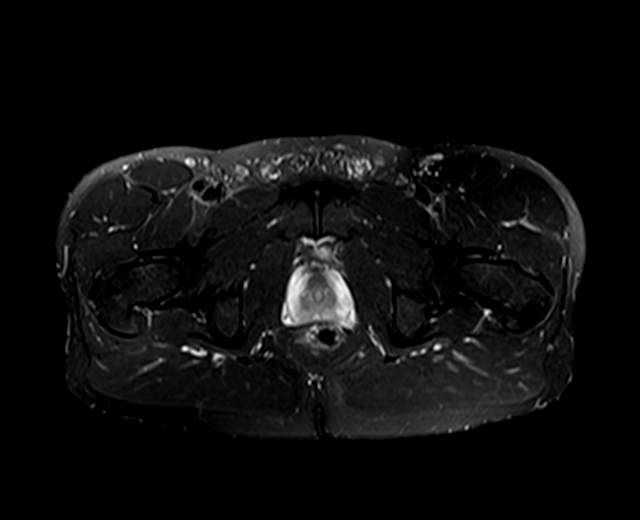
[im 16/26]
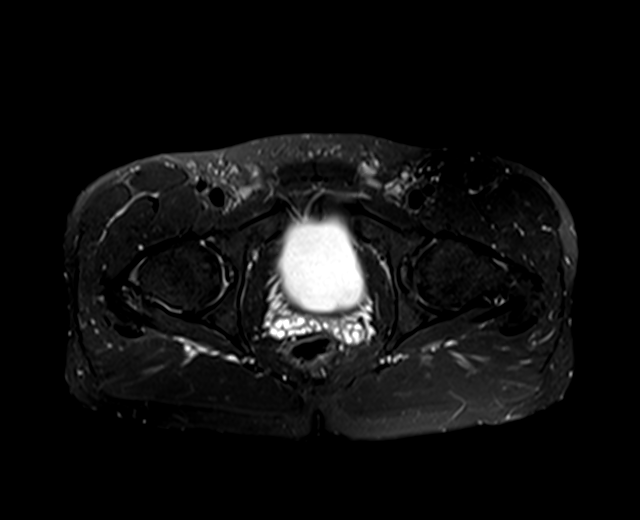
[im 19/26]
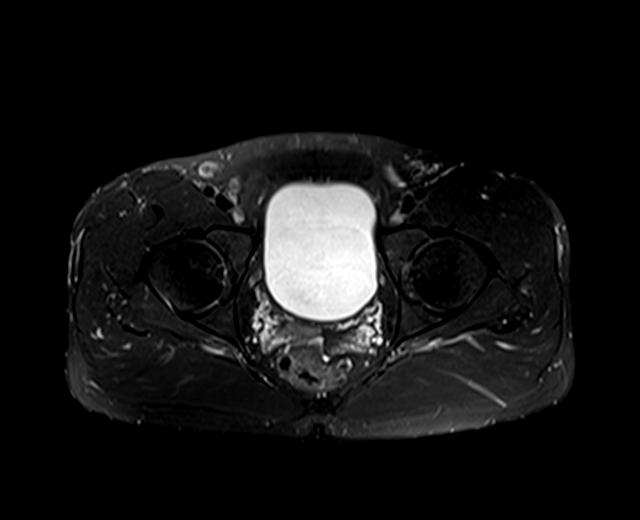
[im 22/26]
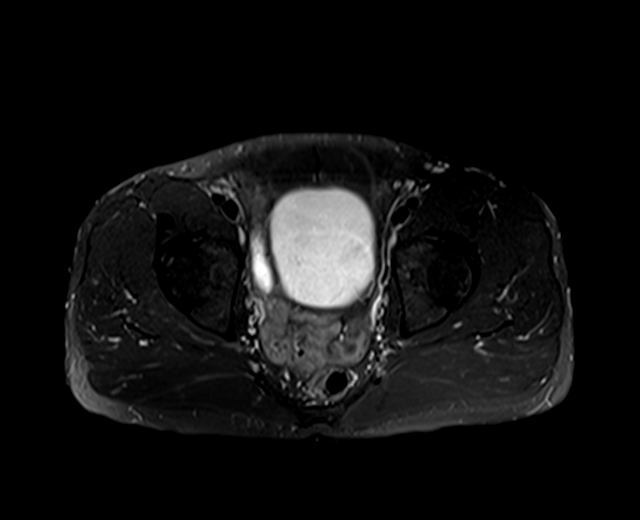
[im 26/26]
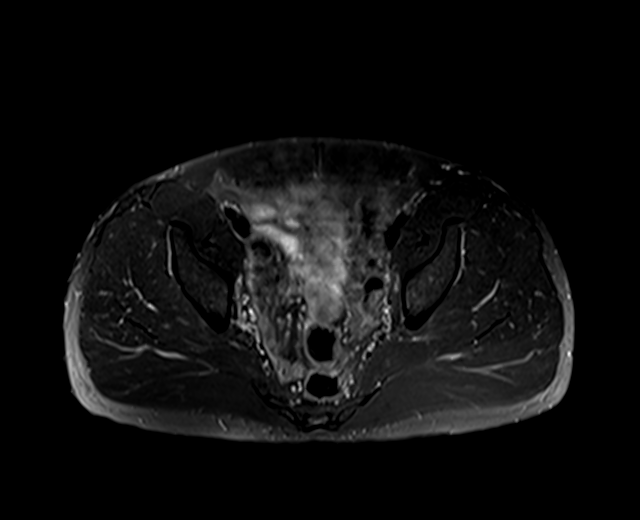

[Series 6: PD fat-sat · sagittal · 4.0mm · 0.70mm/px · 5 of 24 slices shown]
[im 1/24]
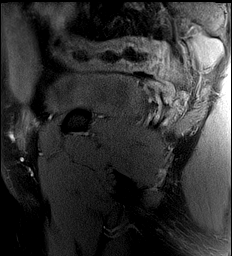
[im 4/24]
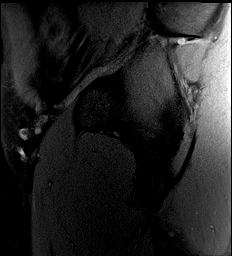
[im 7/24]
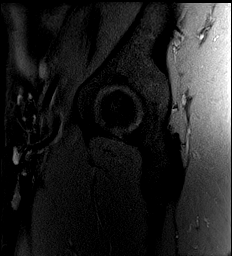
[im 14/24]
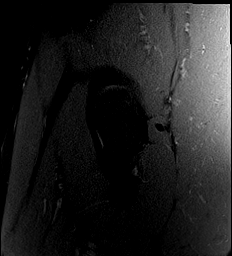
[im 20/24]
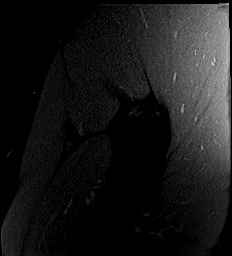

[31 of 40 positions shown; findings below may reference images not displayed]

FINDINGS: Bones: Unremarkable

Articular cartilage and labrum

Articular cartilage:  Unremarkable

Labrum: Longitudinal irregularity in the superior labrum noted on
images 8 through 10 of series 7. Although not conspicuous on the
sagittal images, the appearance on coronal images is suspicious for
degenerative tearing of the superior labrum.

Joint or bursal effusion

Joint effusion:  Absent

Bursae: No regional bursitis

Muscles and tendons

Muscles and tendons: Unremarkable. No correlated findings for
athletic pubalgia.

Other findings

Miscellaneous:   No supplemental non-categorized findings.
IMPRESSION: 1. Longitudinal irregularity in the superior labrum noted on images
8 through 10 of series 7. Although not conspicuous on the sagittal
images, the appearance is suspicious for degenerative tearing of the
superior labrum.

## 2020-05-27 ENCOUNTER — Telehealth: Payer: Self-pay | Admitting: Adult Health

## 2020-05-27 DIAGNOSIS — G8929 Other chronic pain: Secondary | ICD-10-CM

## 2020-05-27 NOTE — Telephone Encounter (Signed)
Spoke to patient and informed him of his recent MRI results   MRI Lumbar Spine showed   1. Mild broad-based posterior disc bulge and facet hypertrophy at L4-5 with resultant mild bilateral lateral recess stenosis. No frank neural impingement. 2. Otherwise essentially normal MRI of the lumbar spine.   MRI of left hip showed   IMPRESSION: 1. Longitudinal irregularity in the superior labrum noted on images 8 through 10 of series 7. Although not conspicuous on the sagittal images, the appearance is suspicious for degenerative tearing of the superior labrum.  Will refer to orthopedics for the left hip as this is the more pressing issue right now

## 2020-06-06 ENCOUNTER — Other Ambulatory Visit: Payer: Self-pay

## 2020-06-06 ENCOUNTER — Ambulatory Visit (INDEPENDENT_AMBULATORY_CARE_PROVIDER_SITE_OTHER): Payer: BC Managed Care – PPO | Admitting: Orthopaedic Surgery

## 2020-06-06 ENCOUNTER — Encounter: Payer: Self-pay | Admitting: Orthopaedic Surgery

## 2020-06-06 VITALS — Ht 66.0 in | Wt 152.0 lb

## 2020-06-06 DIAGNOSIS — G8929 Other chronic pain: Secondary | ICD-10-CM

## 2020-06-06 DIAGNOSIS — M5441 Lumbago with sciatica, right side: Secondary | ICD-10-CM

## 2020-06-06 DIAGNOSIS — M5442 Lumbago with sciatica, left side: Secondary | ICD-10-CM | POA: Diagnosis not present

## 2020-06-06 MED ORDER — PREDNISONE 10 MG (21) PO TBPK: ORAL_TABLET | ORAL | 0 refills | Status: DC

## 2020-06-06 MED ORDER — METHOCARBAMOL 500 MG PO TABS: 500.0000 mg | ORAL_TABLET | Freq: Two times a day (BID) | ORAL | 0 refills | Status: DC | PRN
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
## 2020-06-07 ENCOUNTER — Ambulatory Visit
Admission: RE | Admit: 2020-06-07 | Discharge: 2020-06-07 | Disposition: A | Discharge status: Home / Self Care | Payer: BC Managed Care – PPO | Source: Ambulatory Visit | Attending: Adult Health | Admitting: Adult Health

## 2020-06-07 DIAGNOSIS — M542 Cervicalgia: Secondary | ICD-10-CM

## 2020-06-07 IMAGING — MR MR CERVICAL SPINE W/O CM
5 series · 33 of 48 positions shown · non-contrast
Comparison: None available.

CLINICAL DATA: Initial evaluation for neck pain with radiation to
the upper extremities bilaterally, decreased range of motion.

EXAM:
MRI CERVICAL SPINE WITHOUT CONTRAST
TECHNIQUE: Multiplanar, multisequence MR imaging of the cervical spine was
performed. No intravenous contrast was administered.

[Series 2: T2 · sagittal · 3.0mm · 0.41mm/px · 6 of 15 slices shown (1 of 2)]
[im 1/15]
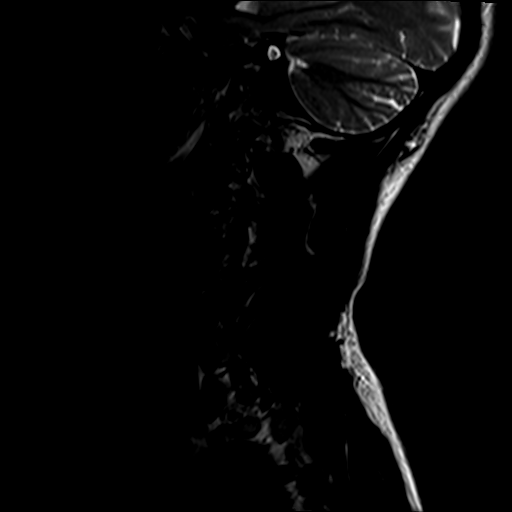
[im 3/15]
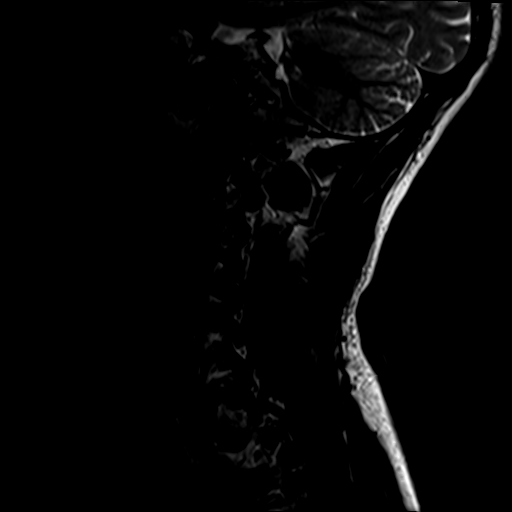
[im 6/15]
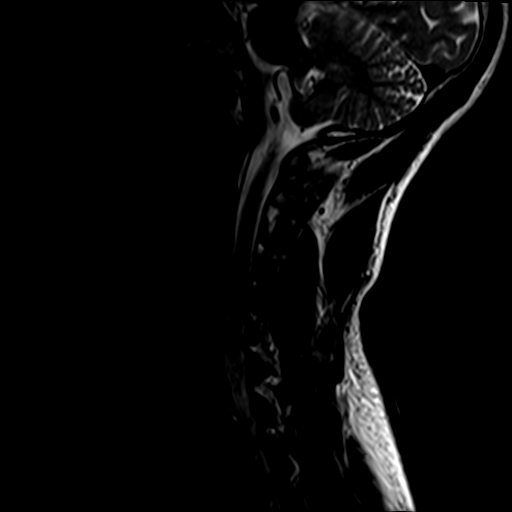
[im 9/15]
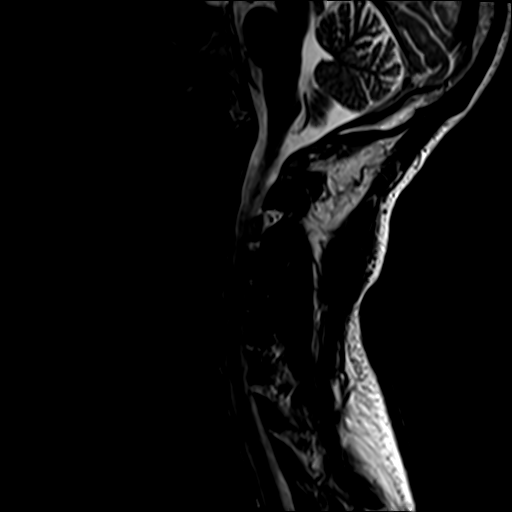
[im 12/15]
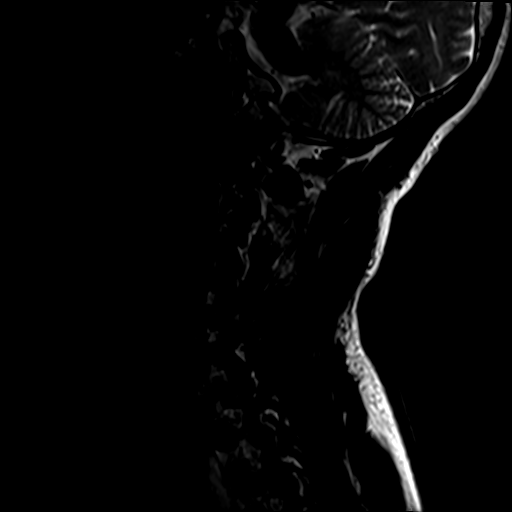
[im 15/15]
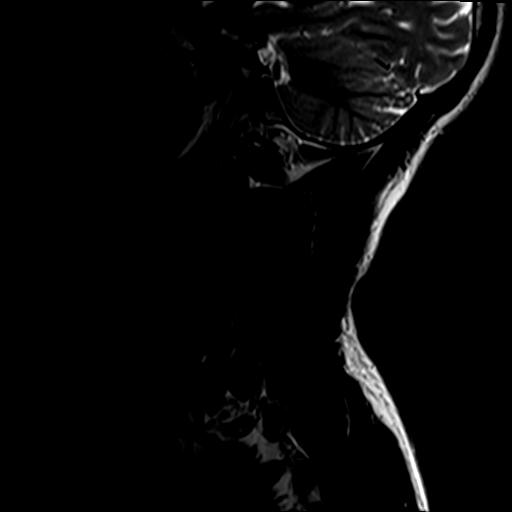

[Series 3: STIR · sagittal · 3.0mm · 0.82mm/px · 7 of 15 slices shown]
[im 1/15]
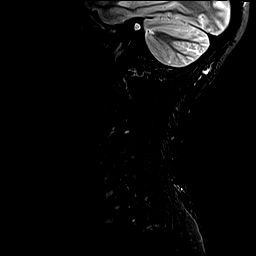
[im 3/15]
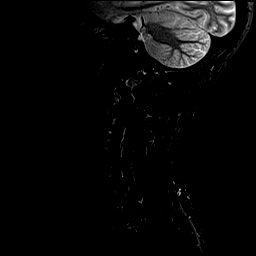
[im 5/15]
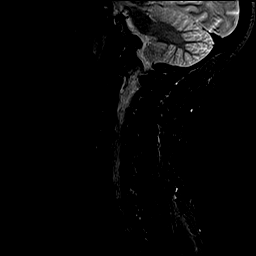
[im 8/15]
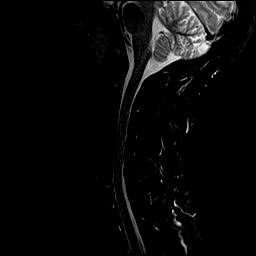
[im 10/15]
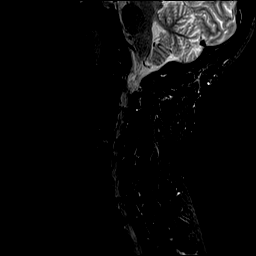
[im 12/15]
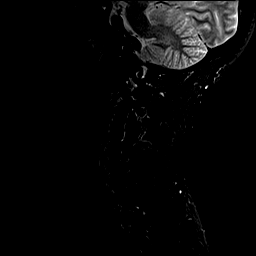
[im 15/15]
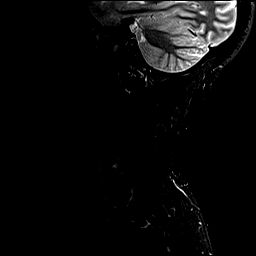

[Series 4: T1 · sagittal · 3.0mm · 0.82mm/px · 7 of 15 slices shown]
[im 1/15]
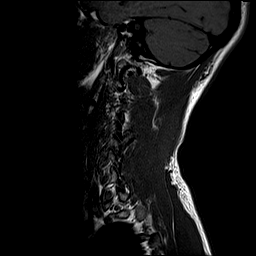
[im 3/15]
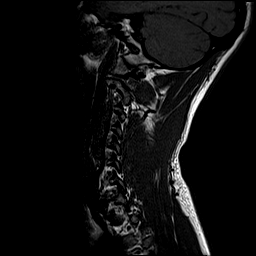
[im 5/15]
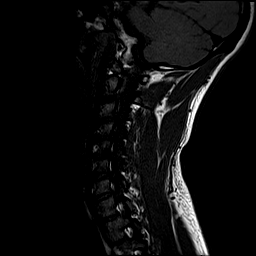
[im 8/15]
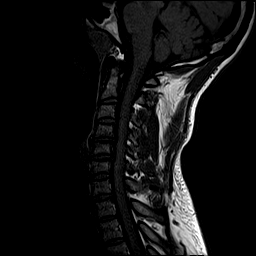
[im 10/15]
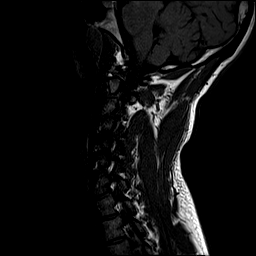
[im 12/15]
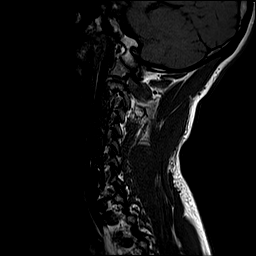
[im 15/15]
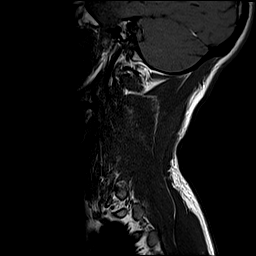

[Series 5: T2 · axial · 3.0mm · 0.70mm/px · z∈[-70,+39]mm · 8 of 31 slices shown (2 of 2)]
[im 1/31]
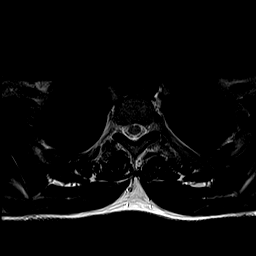
[im 5/31]
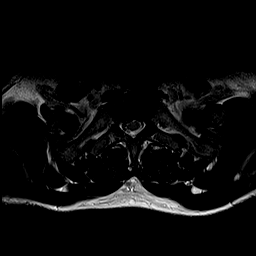
[im 10/31]
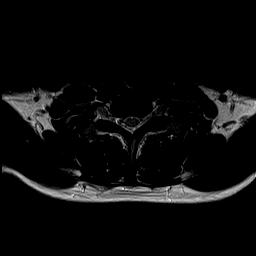
[im 14/31]
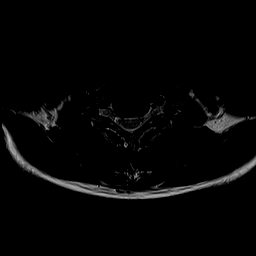
[im 17/31]
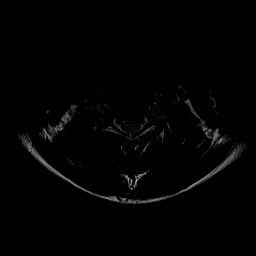
[im 21/31]
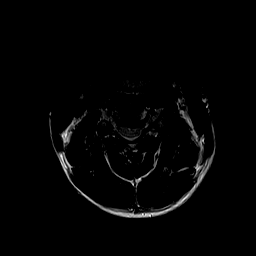
[im 26/31]
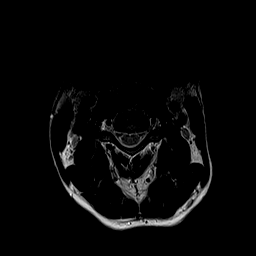
[im 31/31]
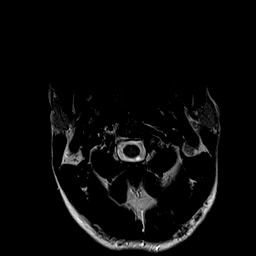

[Series 6: GRE · axial · 3.0mm · 0.35mm/px · z∈[-70,-12]mm · 5 of 31 slices shown]
[im 1/31]
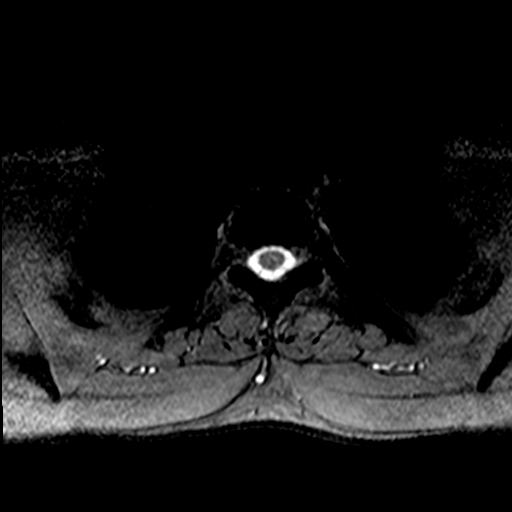
[im 5/31]
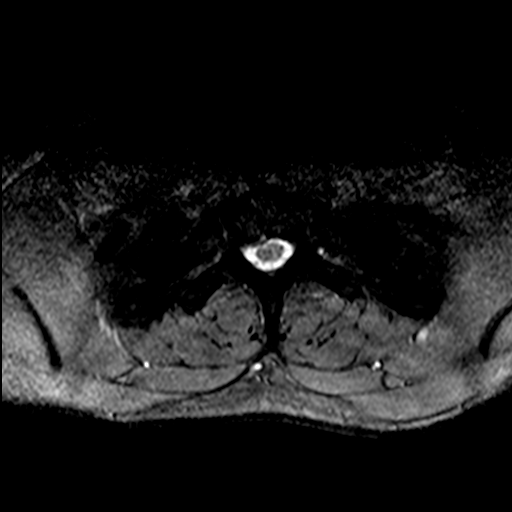
[im 10/31]
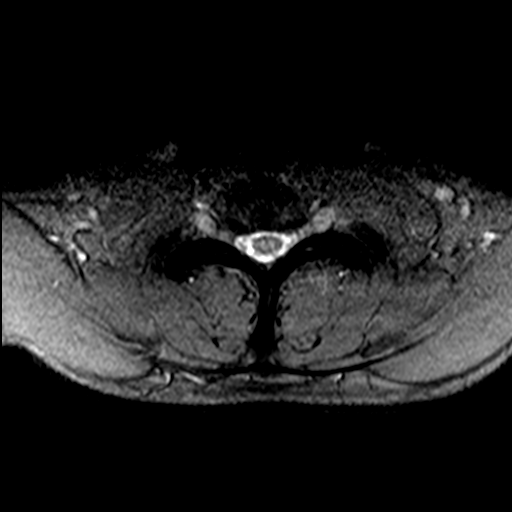
[im 14/31]
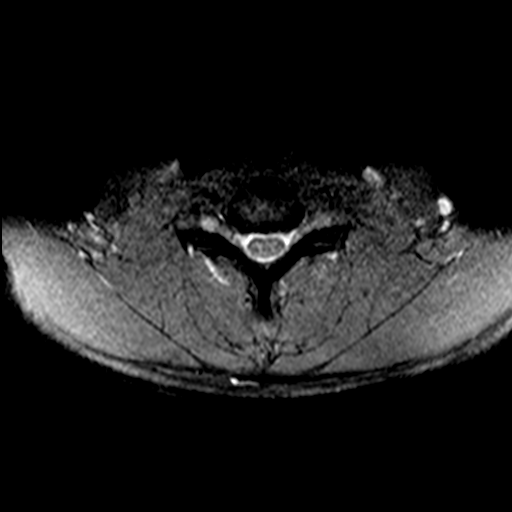
[im 17/31]
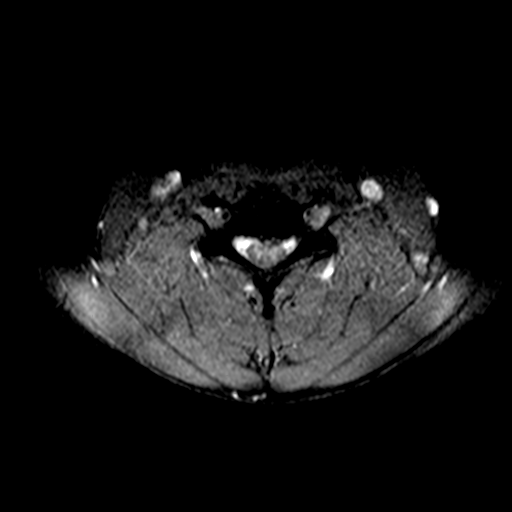

[33 of 48 positions shown; findings below may reference images not displayed]

FINDINGS: Alignment: Mild straightening of the normal cervical lordosis. No
listhesis or static subluxation.

Vertebrae: Vertebral body height well maintained without acute or
chronic fracture. Bone marrow signal intensity within normal limits.
No discrete or worrisome osseous lesions. Mild discogenic reactive
endplate change present about the C4-5 interspace. No other abnormal
marrow edema.

Cord: Normal signal and morphology.

Posterior Fossa, vertebral arteries, paraspinal tissues: Visualized
brain and posterior fossa within normal limits. Craniocervical
junction normal. Paraspinous and prevertebral soft tissues within
normal limits. Normal intravascular flow voids seen within the
vertebral arteries bilaterally.

Disc levels:

C2-C3: Unremarkable.

C3-C4: Mild annular disc bulge. No significant canal or foraminal
stenosis.

C4-C5: Degenerative intervertebral disc space narrowing. Broad base
central disc osteophyte complex indents the ventral thecal sac,
slightly asymmetric to the left. Mild spinal stenosis with mild
flattening of the left ventral cord. No cord signal changes.
Moderate left with mild-to-moderate right C5 foraminal stenosis.

C5-C6: Broad-based central disc osteophyte complex indents the
ventral thecal sac, contacting and mildly flattening the ventral
cord. No cord signal changes. Mild spinal stenosis. Mild to moderate
right C6 foraminal narrowing. Left neural foramina remains patent.

C6-C7:  Unremarkable.

C7-T1: Normal interspace. Mild left-sided facet hypertrophy. No
canal or foraminal stenosis.

T1-2: Mild disc bulge with disc desiccation. Superimposed right
paracentral disc protrusion indents the right ventral thecal sac
(series 6, image 27). Mild flattening of the right ventral cord
without cord signal changes or significant spinal stenosis. Foramina
remain patent.
IMPRESSION: 1. Broad-based central disc osteophyte complex at C4-5 with
resultant mild spinal stenosis, with mild to moderate left worse
than right C5 foraminal narrowing.
2. Central disc osteophyte complex at C5-6 with resultant mild canal
and mild to moderate right C6 foraminal stenosis.
3. Right paracentral disc protrusion at T1-2 with secondary mild
flattening of the right hemi cord.

## 2020-06-10 NOTE — Progress Notes (Signed)
You're welcome.  Thanks for letting me know about the C spine MRI.

## 2020-06-16 ENCOUNTER — Telehealth: Payer: Self-pay | Admitting: Orthopaedic Surgery

## 2020-06-16 NOTE — Telephone Encounter (Signed)
[place] forms received. Sent to [employer]. 

## 2020-06-24 ENCOUNTER — Telehealth: Payer: Self-pay | Admitting: Orthopaedic Surgery

## 2020-06-24 NOTE — Telephone Encounter (Signed)
Received $25.00 cash and medical records release form from patient     Forwarding to [facility]

## 2020-06-25 NOTE — Telephone Encounter (Signed)
Harriett Sine with [employer] called stating the paperwork is missing the pts signature and so Harriett Sine is wanting to send the paperwork and have the pt sign but she states she will need our office to hold onto the paperwork. Harriett Sine states Charlynne Cousins will be coming in the office on 06/26/20 to train with Tammy and the paperwork will most likely be dealt with then. If there are any questions or concerns please give Harriett Sine a CB.   [phone]

## 2020-06-25 NOTE — Telephone Encounter (Signed)
Will move forward when the paperwork is received

## 2020-06-28 ENCOUNTER — Other Ambulatory Visit: Payer: Self-pay

## 2020-06-28 ENCOUNTER — Ambulatory Visit: Payer: BC Managed Care – PPO | Attending: Physician Assistant

## 2020-06-28 DIAGNOSIS — G8929 Other chronic pain: Secondary | ICD-10-CM | POA: Insufficient documentation

## 2020-06-28 DIAGNOSIS — M5442 Lumbago with sciatica, left side: Secondary | ICD-10-CM | POA: Insufficient documentation

## 2020-06-28 DIAGNOSIS — M25661 Stiffness of right knee, not elsewhere classified: Secondary | ICD-10-CM | POA: Diagnosis present

## 2020-06-28 DIAGNOSIS — M25662 Stiffness of left knee, not elsewhere classified: Secondary | ICD-10-CM | POA: Insufficient documentation

## 2020-06-28 DIAGNOSIS — M5441 Lumbago with sciatica, right side: Secondary | ICD-10-CM | POA: Diagnosis present
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
## 2020-06-30 ENCOUNTER — Telehealth: Payer: Self-pay | Admitting: Physical Medicine and Rehabilitation

## 2020-06-30 ENCOUNTER — Ambulatory Visit: Payer: BC Managed Care – PPO | Admitting: Physical Medicine and Rehabilitation

## 2020-06-30 NOTE — Telephone Encounter (Signed)
Called pt and r/s 5/9

## 2020-06-30 NOTE — Telephone Encounter (Signed)
Pt called stating he needs to R/S his appt for today due to a death in the family and would like a CB.   [phone]

## 2020-07-02 ENCOUNTER — Telehealth: Payer: Self-pay | Admitting: Adult Health

## 2020-07-02 ENCOUNTER — Encounter: Payer: Self-pay | Admitting: Adult Health

## 2020-07-02 NOTE — Telephone Encounter (Signed)
The patient called to speak with Shirline Frees about some information that he needs to complete some forms.  Please advise

## 2020-07-03 ENCOUNTER — Telehealth: Payer: Self-pay | Admitting: Adult Health

## 2020-07-03 NOTE — Telephone Encounter (Signed)
Spoke to patient about his concerns, has not heard anything about short term disability for his low back pain - forms given to Dr. Trish Fountain that it looks like in his chart that they were missing a signature on the patients paperwork in order to complete  Patient also wondering what is going on with referral for his neck pain after an MRI - advised that I sent that MRI over to Dr. Roda Shutters since this is the orthopedic that has been treating him

## 2020-07-04 ENCOUNTER — Telehealth: Payer: Self-pay | Admitting: Orthopaedic Surgery

## 2020-07-04 NOTE — Telephone Encounter (Signed)
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

## 2020-07-04 NOTE — Telephone Encounter (Signed)
Patient called talked briefly with [place] ([employer]) quite confusing. His forms completed and faxed. He now would like to talk with you, he canceled 4/25 appt. W/ Dr Alvester Morin. Please call pt. [phone]

## 2020-07-08 ENCOUNTER — Encounter: Payer: Self-pay | Admitting: Orthopaedic Surgery

## 2020-07-08 ENCOUNTER — Other Ambulatory Visit: Payer: Self-pay

## 2020-07-08 ENCOUNTER — Ambulatory Visit (INDEPENDENT_AMBULATORY_CARE_PROVIDER_SITE_OTHER): Payer: BC Managed Care – PPO | Admitting: Orthopaedic Surgery

## 2020-07-08 VITALS — Ht 66.0 in | Wt 152.0 lb

## 2020-07-08 DIAGNOSIS — M542 Cervicalgia: Secondary | ICD-10-CM | POA: Diagnosis not present

## 2020-07-08 DIAGNOSIS — G8929 Other chronic pain: Secondary | ICD-10-CM | POA: Diagnosis not present

## 2020-07-08 MED ORDER — PREDNISONE 5 MG (21) PO TBPK: ORAL_TABLET | ORAL | 0 refills | Status: DC

## 2020-07-08 MED ORDER — METHOCARBAMOL 500 MG PO TABS: 500.0000 mg | ORAL_TABLET | Freq: Two times a day (BID) | ORAL | 0 refills | Status: DC | PRN
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
## 2020-07-10 ENCOUNTER — Ambulatory Visit: Payer: BC Managed Care – PPO | Attending: Physician Assistant | Admitting: Physical Therapy

## 2020-07-10 ENCOUNTER — Telehealth: Payer: Self-pay | Admitting: Physical Therapy

## 2020-07-10 DIAGNOSIS — M5441 Lumbago with sciatica, right side: Secondary | ICD-10-CM | POA: Insufficient documentation

## 2020-07-10 DIAGNOSIS — G8929 Other chronic pain: Secondary | ICD-10-CM | POA: Insufficient documentation

## 2020-07-10 DIAGNOSIS — M5442 Lumbago with sciatica, left side: Secondary | ICD-10-CM | POA: Insufficient documentation

## 2020-07-10 DIAGNOSIS — M25661 Stiffness of right knee, not elsewhere classified: Secondary | ICD-10-CM | POA: Insufficient documentation

## 2020-07-10 DIAGNOSIS — M25662 Stiffness of left knee, not elsewhere classified: Secondary | ICD-10-CM | POA: Insufficient documentation

## 2020-07-10 DIAGNOSIS — M542 Cervicalgia: Secondary | ICD-10-CM | POA: Insufficient documentation

## 2020-07-10 NOTE — Telephone Encounter (Signed)
Called patient.  He reports he called to to let clinic know he would not be attending today.  I reminded him of next appt.

## 2020-07-12 ENCOUNTER — Ambulatory Visit: Payer: BC Managed Care – PPO | Admitting: Physical Therapy

## 2020-07-12 ENCOUNTER — Other Ambulatory Visit: Payer: Self-pay

## 2020-07-12 DIAGNOSIS — M5442 Lumbago with sciatica, left side: Secondary | ICD-10-CM

## 2020-07-12 DIAGNOSIS — M25662 Stiffness of left knee, not elsewhere classified: Secondary | ICD-10-CM

## 2020-07-12 DIAGNOSIS — M542 Cervicalgia: Secondary | ICD-10-CM | POA: Diagnosis present

## 2020-07-12 DIAGNOSIS — M25661 Stiffness of right knee, not elsewhere classified: Secondary | ICD-10-CM | POA: Diagnosis present

## 2020-07-12 DIAGNOSIS — M5441 Lumbago with sciatica, right side: Secondary | ICD-10-CM | POA: Diagnosis present

## 2020-07-12 DIAGNOSIS — G8929 Other chronic pain: Secondary | ICD-10-CM | POA: Diagnosis present

## 2020-07-12 NOTE — Therapy (Addendum)
[facility] Outpatient Rehabilitation [facility] [address] [place], [place], [address] Phone: [phone]   Fax:  [phone]  Physical Therapy Treatment  Patient Details  Name: Oscar Mercado MRN: [id number] Date of Birth: 1981-03-07 Referring Provider (PT): Cristie Hem, [place]   Encounter Date: 07/12/2020   PT End of Session - 07/12/20 0817    Visit Number 2    Number of Visits 13    Date for PT Re-Evaluation 08/16/20    Authorization Type BCBS COMM PPO    Authorization Time Period Re-assess FOTO at the 5th and 10 visits    Progress Note Due on Visit 10    PT Start Time 0818    PT Stop Time 0901    PT Time Calculation (min) 43 min    Activity Tolerance Patient tolerated treatment well    Behavior During Therapy [facility] for tasks assessed/performed           Past Medical History:  Diagnosis Date  . Carpal tunnel syndrome    bilateral  . Narcolepsy and cataplexy   . PTSD (post-traumatic stress disorder)   . Stye     Past Surgical History:  Procedure Laterality Date  . ORIF MANDIBULAR FRACTURE      There were no vitals filed for this visit.   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  He is currently out of work d/t his back and neck pain.  He reports that prone on elbows is helpful in the moment, but "the pain lingers"    Pertinent History PTSD    Diagnostic tests MRI Lumbar 05/25/20:   IMPRESSION:  1. Mild broad-based posterior disc bulge and facet hypertrophy at  L4-5 with resultant mild bilateral lateral recess stenosis. No frank  neural impingement.  2. Otherwise essentially normal MRI of the lumbar spine. MRI L hip 05/26/20: IMPRESSION:  1. Longitudinal irregularity in the superior labrum noted on images  8 through 10 of series 7. Although not conspicuous on the sagittal  images, the appearance is suspicious for degenerative tearing of the   superior labrum.    Currently in Pain? Yes    Pain Score 5     Pain Location Back    Pain Orientation Lower    Pain Radiating Towards none currently, this is getting better since stopping work                             [facility] Adult PT Treatment/Exercise - 07/12/20 0001      Lumbar Exercises: Stretches   Lower Trunk Rotation Limitations 20x    Other Lumbar Stretch Exercise open book 15x ea      Lumbar Exercises: Aerobic   Nustep 5 min      Lumbar Exercises: Supine   Pelvic Tilt Limitations 5''x10    Other Supine Lumbar Exercises clamshell - alternating - 2x10 ea      Knee/Hip Exercises: Supine   Hip Adduction Isometric Limitations 5''x10      Manual Therapy   Manual therapy comments CPA lumbar spine, grade III, pt in prone                    PT Short Term Goals - 06/29/20 2331      PT SHORT TERM GOAL #1   Title Pt will be Ind in an initial HEP  Baseline started on eval    Status New    Target Date 07/20/20      PT SHORT TERM GOAL #2   Title Pt will voice understanding of measures to assist in the reduction of low back and LE pain             PT Long Term Goals - 06/29/20 2333      PT LONG TERM GOAL #1   Title Pt will demonstrate bilat hamstring flexibility to 65d with out reproduction of low back and LE pain.    Baseline 60d    Status New    Target Date 08/16/20      PT LONG TERM GOAL #2   Title Pt will report centralization of bilat LE pain and a low back pain range for 4/10 or less with activities of daily living    Target Date 08/16/20      PT LONG TERM GOAL #3   Title Pt's FOTO score will improve to the predicted value of 61%    Baseline 47%    Status New    Target Date 08/16/20      PT LONG TERM GOAL #4   Title Pt will be Ind in a final HEP to maintain or progress achieved LOF.    Status New    Target Date 08/16/20      PT LONG TERM GOAL #5   Title Establish work related goals as pt progresses toward RTW                  Plan - 07/12/20 0840    Clinical Impression Statement Pt presents with no radicular sxs today.  He does seem to have some positive response to ext so utilized PAs during manual therapy.  Hypomobile lumbar spine with paraspinal hypertrophy.  Lumbar/hip strengthening program innitiated.  He brings in an order for PT on his neck as well which I gave to schedulers.  Added PPT to HEP.    Personal Factors and Comorbidities Past/Current Experience;Time since onset of injury/illness/exacerbation;Comorbidity 1    Comorbidities PTSD    Examination-Activity Limitations Stand;Lift;Bend    Examination-Participation Restrictions Occupation    Stability/Clinical Decision Making Stable/Uncomplicated    Rehab Potential Good    PT Frequency 2x / week    PT Duration 6 weeks    PT Treatment/Interventions ADLs/Self Care Home Management;Cryotherapy;Electrical Stimulation;Ultrasound;Traction;Moist Heat;Iontophoresis 4mg /ml Dexamethasone;Therapeutic activities;Therapeutic exercise;Manual techniques;Patient/family education;Dry needling;Taping;Spinal Manipulations    PT Next Visit Plan Assess reposnse to HEP; progress lumbar strengthening    PT Home Exercise Plan [email or web address]    Consulted and Agree with Plan of Care Patient           Patient will benefit from skilled therapeutic intervention in order to improve the following deficits and impairments:  Pain,Decreased activity tolerance,Impaired flexibility  Visit Diagnosis: Chronic bilateral low back pain with bilateral sciatica  Decreased range of motion of both lower extremities     Problem List Patient Active Problem List   Diagnosis Date Noted  . 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 Daytime somnolence 11/01/2018    11/03/2018 PT, DPT 07/12/20 9:05 AM  [facility] Outpatient Rehabilitation [facility] [address] [place], [place], [place] Phone: [phone]   Fax:   [phone]  Name: Oscar Mercado MRN: Sinclair Ship Date of Birth: May 21, 1980

## 2020-07-14 ENCOUNTER — Other Ambulatory Visit: Payer: Self-pay

## 2020-07-14 ENCOUNTER — Ambulatory Visit: Payer: Self-pay

## 2020-07-14 ENCOUNTER — Ambulatory Visit (INDEPENDENT_AMBULATORY_CARE_PROVIDER_SITE_OTHER): Payer: BC Managed Care – PPO | Admitting: Physical Medicine and Rehabilitation

## 2020-07-14 ENCOUNTER — Encounter: Payer: Self-pay | Admitting: Physical Medicine and Rehabilitation

## 2020-07-14 VITALS — BP 123/79 | HR 81

## 2020-07-14 DIAGNOSIS — M5416 Radiculopathy, lumbar region: Secondary | ICD-10-CM

## 2020-07-14 MED ORDER — BETAMETHASONE SOD PHOS & ACET 6 (3-3) MG/ML IJ SUSP
12.0000 mg | Freq: Once | INTRAMUSCULAR | Status: AC
  Administered 2020-07-14: 12 mg

## 2020-07-14 NOTE — Patient Instructions (Signed)

## 2020-07-14 NOTE — Progress Notes (Signed)
Pt state lower back pain that travels to both hamstring area. Pt state walking, standing and bending. Pt state he take pain meds and heating pads to help ease his pain.  Numeric Pain Rating Scale and Functional Assessment Average Pain 3   In the last MONTH (on 0-10 scale) has pain interfered with the following?  1. General activity like being  able to carry out your everyday physical activities such as walking, climbing stairs, carrying groceries, or moving a chair?  Rating(7)   +Driver, -BT, -Dye Allergies.

## 2020-07-14 NOTE — Progress Notes (Signed)
Oscar Mercado - 40 y.o. male MRN [id number]  Date of birth: 21-Jan-1981  Office Visit Note: Visit Date: 07/14/2020 PCP: Shirline Frees, NP Referred by: Shirline Frees, NP  Subjective: No chief complaint on file.  HPI:  Oscar Mercado is a 40 y.o. male who comes in today at the request of Dr. Glee Arvin for planned Left L4-L5 Lumbar epidural steroid injection with fluoroscopic guidance.  The patient has failed conservative care including home exercise, medications, time and activity modification.  This injection will be diagnostic and hopefully therapeutic.  Please see requesting physician notes for further details and justification. MRI reviewed with images and spine model.  MRI reviewed in the note below.  He reports average 3 out of 10 pain but severe pain at times with bending and standing and walking with some referral to the left more than right hamstring.    ROS Otherwise per HPI.  Assessment & Plan: Visit Diagnoses:    ICD-10-CM   1. Lumbar radiculopathy  M54.16 XR C-ARM NO REPORT    Epidural Steroid injection    betamethasone acetate-betamethasone sodium phosphate (CELESTONE) injection 12 mg    Plan: No additional findings.   Meds & Orders:  Meds ordered this encounter  Medications  . betamethasone acetate-betamethasone sodium phosphate (CELESTONE) injection 12 mg    Orders Placed This Encounter  Procedures  . XR C-ARM NO REPORT  . Epidural Steroid injection    Follow-up: Return if symptoms worsen or fail to improve.   Procedures: No procedures performed  Lumbar Epidural Steroid Injection - Interlaminar Approach with Fluoroscopic Guidance  Patient: Oscar Mercado      Date of Birth: Apr 05, 1980 MRN: [id number] PCP: Shirline Frees, NP      Visit Date: 07/14/2020   Universal Protocol:     Consent Given By: the patient  Position: PRONE  Additional Comments: Vital signs were monitored before and after the procedure. Patient was prepped and draped  in the usual sterile fashion. The correct patient, procedure, and site was verified.   Injection Procedure Details:   Procedure diagnoses: Lumbar radiculopathy [M54.16]   Meds Administered:  Meds ordered this encounter  Medications  . betamethasone acetate-betamethasone sodium phosphate (CELESTONE) injection 12 mg     Laterality: Left  Location/Site:  L4-L5  Needle: 3.5 in., 20 ga. Tuohy  Needle Placement: Paramedian epidural  Findings:   -Comments: Excellent flow of contrast into the epidural space.  Procedure Details: Using a paramedian approach from the side mentioned above, the region overlying the inferior lamina was localized under fluoroscopic visualization and the soft tissues overlying this structure were infiltrated with 4 ml. of 1% Lidocaine without Epinephrine. The Tuohy needle was inserted into the epidural space using a paramedian approach.   The epidural space was localized using loss of resistance along with counter oblique bi-planar fluoroscopic views.  After negative aspirate for air, blood, and CSF, a 2 ml. volume of Isovue-250 was injected into the epidural space and the flow of contrast was observed. Radiographs were obtained for documentation purposes.    The injectate was administered into the level noted above.   Additional Comments:  The patient tolerated the procedure well Dressing: 2 x 2 sterile gauze and Band-Aid    Post-procedure details: Patient was observed during the procedure. Post-procedure instructions were reviewed.  Patient left the clinic in stable condition.     Clinical History: MRI LUMBAR SPINE WITHOUT CONTRAST  TECHNIQUE: Multiplanar, multisequence MR imaging of the lumbar spine was performed. No intravenous contrast was administered.  COMPARISON:  Prior radiograph from 05/06/2020.  FINDINGS: Segmentation: Standard. Lowest well-formed disc space labeled the L5-S1 level.  Alignment: Trace dextroscoliosis.  Alignment otherwise normal with preservation of the normal lumbar lordosis. No listhesis.  Vertebrae: Vertebral body height maintained without acute or chronic fracture. Bone marrow signal intensity within normal limits. No discrete or worrisome osseous lesions. No abnormal marrow edema.  Conus medullaris and cauda equina: Conus extends to the T12 level. Conus and cauda equina appear normal.  Paraspinal and other soft tissues: Unremarkable.  Disc levels:  L1-2:  Unremarkable.  L2-3:  Unremarkable.  L3-4:  Unremarkable.  L4-5: Mild broad-based posterior disc bulge, slightly asymmetric to the right. Mild facet hypertrophy. Resultant mild narrowing of the lateral recesses bilaterally. Central canal remains patent. No significant foraminal stenosis.  L5-S1:  Unremarkable.  IMPRESSION: 1. Mild broad-based posterior disc bulge and facet hypertrophy at L4-5 with resultant mild bilateral lateral recess stenosis. No frank neural impingement. 2. Otherwise essentially normal MRI of the lumbar spine.   Electronically Signed   By: Rise Mu M.D.   On: 05/25/2020 03:38     Objective:  VS:  HT:    WT:   BMI:     BP:123/79  HR:81bpm  TEMP: ( )  RESP:  Physical Exam Vitals and nursing note reviewed.  Constitutional:      General: He is not in acute distress.    Appearance: Normal appearance. He is not ill-appearing.  HENT:     Head: Normocephalic and atraumatic.     Right Ear: External ear normal.     Left Ear: External ear normal.     Nose: No congestion.  Eyes:     Extraocular Movements: Extraocular movements intact.  Cardiovascular:     Rate and Rhythm: Normal rate.     Pulses: Normal pulses.  Pulmonary:     Effort: Pulmonary effort is normal. No respiratory distress.  Abdominal:     General: There is no distension.     Palpations: Abdomen is soft.  Musculoskeletal:        General: No tenderness or signs of injury.     Cervical back: Neck  supple.     Right lower leg: No edema.     Left lower leg: No edema.     Comments: Patient has good distal strength without clonus.  Improved with positive left slump test.  Skin:    Findings: No erythema or rash.  Neurological:     General: No focal deficit present.     Mental Status: He is alert and oriented to person, place, and time.     Sensory: No sensory deficit.     Motor: No weakness or abnormal muscle tone.     Coordination: Coordination normal.  Psychiatric:        Mood and Affect: Mood normal.        Behavior: Behavior normal.      Imaging: XR C-ARM NO REPORT  Result Date: 07/14/2020 Please see Notes tab for imaging impression.

## 2020-07-14 NOTE — Procedures (Signed)
Lumbar Epidural Steroid Injection - Interlaminar Approach with Fluoroscopic Guidance  Patient: Oscar Mercado      Date of Birth: 1980-06-28 MRN: [id number] PCP: Shirline Frees, NP      Visit Date: 07/14/2020   Universal Protocol:     Consent Given By: the patient  Position: PRONE  Additional Comments: Vital signs were monitored before and after the procedure. Patient was prepped and draped in the usual sterile fashion. The correct patient, procedure, and site was verified.   Injection Procedure Details:   Procedure diagnoses: Lumbar radiculopathy [M54.16]   Meds Administered:  Meds ordered this encounter  Medications  . betamethasone acetate-betamethasone sodium phosphate (CELESTONE) injection 12 mg     Laterality: Left  Location/Site:  L4-L5  Needle: 3.5 in., 20 ga. Tuohy  Needle Placement: Paramedian epidural  Findings:   -Comments: Excellent flow of contrast into the epidural space.  Procedure Details: Using a paramedian approach from the side mentioned above, the region overlying the inferior lamina was localized under fluoroscopic visualization and the soft tissues overlying this structure were infiltrated with 4 ml. of 1% Lidocaine without Epinephrine. The Tuohy needle was inserted into the epidural space using a paramedian approach.   The epidural space was localized using loss of resistance along with counter oblique bi-planar fluoroscopic views.  After negative aspirate for air, blood, and CSF, a 2 ml. volume of Isovue-250 was injected into the epidural space and the flow of contrast was observed. Radiographs were obtained for documentation purposes.    The injectate was administered into the level noted above.   Additional Comments:  The patient tolerated the procedure well Dressing: 2 x 2 sterile gauze and Band-Aid    Post-procedure details: Patient was observed during the procedure. Post-procedure instructions were reviewed.  Patient left the  clinic in stable condition.

## 2020-07-17 ENCOUNTER — Ambulatory Visit: Payer: BC Managed Care – PPO | Admitting: Physical Therapy

## 2020-07-17 ENCOUNTER — Other Ambulatory Visit: Payer: Self-pay

## 2020-07-17 DIAGNOSIS — G8929 Other chronic pain: Secondary | ICD-10-CM

## 2020-07-17 DIAGNOSIS — M542 Cervicalgia: Secondary | ICD-10-CM | POA: Diagnosis not present

## 2020-07-17 DIAGNOSIS — M25661 Stiffness of right knee, not elsewhere classified: Secondary | ICD-10-CM

## 2020-07-17 DIAGNOSIS — M5441 Lumbago with sciatica, right side: Secondary | ICD-10-CM
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
## 2020-07-17 NOTE — Patient Instructions (Signed)
Access Code: [email or web address] URL: [email or web address] Date: 07/17/2020 Prepared by: Alphonzo Severance  Exercises Lying Prone - 3-6 x daily - 7 x weekly - 1 sets - 5 min hold Prone Press Up On Elbows - 1 x daily - 7 x weekly - 1 sets - 1 reps - 5 mins hold Prone Press Up - 1 x daily - 7 x weekly - 1 sets - 10 reps - 3 hold Supine Posterior Pelvic Tilt - 1 x daily - 7 x weekly - 3 sets - 10 reps Supine Cervical Retraction with Towel - 1 x daily - 7 x weekly - 3 sets - 10 reps Seated Assisted Cervical Rotation with Towel - 2 x daily - 7 x weekly - 3 sets - 10 reps

## 2020-07-19 ENCOUNTER — Other Ambulatory Visit: Payer: Self-pay

## 2020-07-19 ENCOUNTER — Encounter: Payer: Self-pay | Admitting: Physical Therapy

## 2020-07-19 ENCOUNTER — Ambulatory Visit: Payer: BC Managed Care – PPO | Admitting: Physical Therapy

## 2020-07-19 DIAGNOSIS — M542 Cervicalgia: Secondary | ICD-10-CM

## 2020-07-19 DIAGNOSIS — M5441 Lumbago with sciatica, right side: Secondary | ICD-10-CM

## 2020-07-19 DIAGNOSIS — M25661 Stiffness of right knee, not elsewhere classified: Secondary | ICD-10-CM

## 2020-07-19 DIAGNOSIS — G8929 Other chronic pain: Secondary | ICD-10-CM

## 2020-07-19 NOTE — Therapy (Signed)
[facility] Outpatient Rehabilitation [facility] [address] [place], [place], [address] Phone: [phone]   Fax:  [phone]  Physical Therapy Treatment  Patient Details  Name: Oscar Mercado MRN: [id number] Date of Birth: June 11, 1980 Referring Provider (PT): Cristie Hem, [place]   Encounter Date: 07/19/2020   PT End of Session - 07/19/20 1117    Visit Number 4    Number of Visits 13    Date for PT Re-Evaluation 08/16/20    Authorization Type BCBS COMM PPO    Authorization Time Period Re-assess FOTO at the 6th and 10 visits    Progress Note Due on Visit 10    PT Start Time 1115    PT Stop Time 1201    PT Time Calculation (min) 46 min    Activity Tolerance Patient tolerated treatment well    Behavior During Therapy [facility] for tasks assessed/performed           Past Medical History:  Diagnosis Date  . Carpal tunnel syndrome    bilateral  . Narcolepsy and cataplexy   . PTSD (post-traumatic stress disorder)   . Stye     Past Surgical History:  Procedure Laterality Date  . ORIF MANDIBULAR FRACTURE      There were no vitals filed for this visit.   Subjective Assessment - 07/19/20 1118    Subjective Pt reports that he is doing well today.  He has no pain in his neck currently, but his back is feeling sore today.  He feels that physical therapy has been helpful and he has seen some improvement.    Pertinent History PTSD    Diagnostic tests MRI Lumbar 05/25/20:   IMPRESSION:  1. Mild broad-based posterior disc bulge and facet hypertrophy at  L4-5 with resultant mild bilateral lateral recess stenosis. No frank  neural impingement.  2. Otherwise essentially normal MRI of the lumbar spine. MRI L hip 05/26/20: IMPRESSION:  1. Longitudinal irregularity in the superior labrum noted on images  8 through 10 of series 7. Although not conspicuous on the sagittal  images, the appearance is suspicious for degenerative tearing of the  superior labrum.    Patient Stated  Goals Improve ROM in neck and reduce pain    Currently in Pain? Yes    Pain Score 5     Pain Location Back    Pain Orientation Lower    Pain Type Chronic pain    Pain Onset More than a month ago                             [facility] Adult PT Treatment/Exercise - 07/19/20 0001      Lumbar Exercises: Stretches   Lower Trunk Rotation Limitations 20x    Other Lumbar Stretch Exercise quadruped thread the needle - 10x ea      Lumbar Exercises: Aerobic   Nustep 5 min      Lumbar Exercises: Supine   Pelvic Tilt Limitations 5''x10    Bridge Limitations 3x10    Other Supine Lumbar Exercises clamshell - alternating - green - 2x10 ea      Lumbar Exercises: Quadruped   Single Arm Raises Limitations 10x      Knee/Hip Exercises: Supine   Hip Adduction Isometric Limitations 5''x10      Knee/Hip Exercises: Sidelying   Hip ADduction Limitations 5x 10''      Manual Therapy   Manual therapy comments CPA lumbar spine, grade III, pt in  prone; IASTM lumbar paraspinals and QL, pt in pron                    PT Short Term Goals - 06/29/20 2331      PT SHORT TERM GOAL #1   Title Pt will be Ind in an initial HEP    Baseline started on eval    Status New    Target Date 07/20/20      PT SHORT TERM GOAL #2   Title Pt will voice understanding of measures to assist in the reduction of low back and LE pain             PT Long Term Goals - 07/17/20 1335      PT LONG TERM GOAL #1   Title Pt will demonstrate bilat hamstring flexibility to 65d with out reproduction of low back and LE pain.    Baseline 60d    Status New      PT LONG TERM GOAL #2   Title Pt will report centralization of bilat LE pain and a low back pain range for 4/10 or less with activities of daily living      PT LONG TERM GOAL #3   Title Pt's FOTO score will improve to the predicted value of 61%    Baseline 47%    Status New      PT LONG TERM GOAL #4   Title Pt will be Ind in a final HEP to  maintain or progress achieved LOF.    Status New      PT LONG TERM GOAL #5   Title Establish work related goals as pt progresses toward RTW      Additional Long Term Goals   Additional Long Term Goals Yes      PT LONG TERM GOAL #6   Title Pt will be able to return to work, not limited by neck pain    Time 4    Period Weeks    Target Date 08/16/20                 Plan - 07/19/20 1127    Clinical Impression Statement Pt is progressing well with therapy.  His neck pain is absent today which is a significant improvement.  We continued working on core strengthening/gradual loading of core muscles today.  He is tolerating this well and we were able to add in supine bridge today.  We will continue progressing as able.  Pt shows stronger PPT with minimal cuing today.    Personal Factors and Comorbidities Past/Current Experience;Time since onset of injury/illness/exacerbation;Comorbidity 1    Comorbidities PTSD    Examination-Activity Limitations Stand;Lift;Bend    Examination-Participation Restrictions Occupation    Stability/Clinical Decision Making Stable/Uncomplicated    Rehab Potential Good    PT Frequency 2x / week    PT Duration 6 weeks    PT Treatment/Interventions ADLs/Self Care Home Management;Cryotherapy;Electrical Stimulation;Ultrasound;Traction;Moist Heat;Iontophoresis 4mg /ml Dexamethasone;Therapeutic activities;Therapeutic exercise;Manual techniques;Patient/family education;Dry needling;Taping;Spinal Manipulations    PT Next Visit Plan Assess reposnse to HEP; progress lumbar strengthening    PT Home Exercise Plan [id number]    Consulted and Agree with Plan of Care Patient           Patient will benefit from skilled therapeutic intervention in order to improve the following deficits and impairments:  Pain,Decreased activity tolerance,Impaired flexibility  Visit Diagnosis: Neck pain  Chronic bilateral low back pain with bilateral sciatica  Decreased range of motion  of both lower extremities  Problem List Patient Active Problem List   Diagnosis Date Noted  . 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 Daytime somnolence 11/01/2018    Fredderick Phenix 07/19/2020, 12:05 PM  [facility] [address] [place], [place], [address] Phone: [phone]   Fax:  [phone]  Name: Oscar Mercado MRN: [id number] Date of Birth: December 30, 1980

## 2020-07-24 ENCOUNTER — Ambulatory Visit: Payer: BC Managed Care – PPO | Admitting: Physical Therapy

## 2020-07-26 ENCOUNTER — Other Ambulatory Visit: Payer: Self-pay

## 2020-07-26 ENCOUNTER — Ambulatory Visit: Payer: BC Managed Care – PPO | Admitting: Physical Therapy

## 2020-07-26 ENCOUNTER — Encounter: Payer: Self-pay | Admitting: Physical Therapy

## 2020-07-26 DIAGNOSIS — M5442 Lumbago with sciatica, left side: Secondary | ICD-10-CM

## 2020-07-26 DIAGNOSIS — G8929 Other chronic pain: Secondary | ICD-10-CM

## 2020-07-26 DIAGNOSIS — M542 Cervicalgia: Secondary | ICD-10-CM | POA: Diagnosis not present

## 2020-07-26 DIAGNOSIS — M25661 Stiffness of right knee, not elsewhere classified: Secondary | ICD-10-CM

## 2020-07-26 NOTE — Therapy (Addendum)
[facility] Outpatient Rehabilitation [facility] [address] [place], [place], [address] Phone: [phone]   Fax:  [phone]  PHYSICAL THERAPY UNPLANNED DISCHARGE SUMMARY   Visits from Start of Care: 5  Current functional level related to goals / functional outcomes: Current status unknown   Remaining deficits: Current status unknown   Education / Equipment: Pt has not returned since visit listed below  Patient goals were not assessed. Patient is being discharged due to not returning since the last visit.  (the below note was addended to include the above D/C summary on 10/11/20)  Physical Therapy Treatment  Patient Details  Name: Oscar Mercado MRN: [id number] Date of Birth: 04-25-1980 Referring Provider (PT): Cristie Hem, [place]   Encounter Date: 07/26/2020   PT End of Session - 07/26/20 0819     Visit Number 5    Number of Visits 13    Date for PT Re-Evaluation 08/16/20    Authorization Type BCBS COMM PPO    Authorization Time Period Re-assess FOTO at the 6th and 10 visits    Progress Note Due on Visit 10    PT Start Time 0820    PT Stop Time 0900    PT Time Calculation (min) 40 min    Activity Tolerance Patient tolerated treatment well    Behavior During Therapy WFL for tasks assessed/performed             Past Medical History:  Diagnosis Date   Carpal tunnel syndrome    bilateral   Narcolepsy and cataplexy    PTSD (post-traumatic stress disorder)    Stye     Past Surgical History:  Procedure Laterality Date   ORIF MANDIBULAR FRACTURE      There were no vitals filed for this visit.   Subjective Assessment - 07/26/20 0820     Subjective Pt reports that his low back is hurting more than usual.  he was cleaning his house yesterday which caused some increase in his low back pain. 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    Pertinent History PTSD    Diagnostic tests MRI Lumbar 05/25/20:   IMPRESSION:  1.  Mild broad-based posterior disc bulge and facet hypertrophy at  L4-5 with resultant mild bilateral lateral recess stenosis. No frank  neural impingement.  2. Otherwise essentially normal MRI of the lumbar spine. MRI L hip 05/26/20: IMPRESSION:  1. Longitudinal irregularity in the superior labrum noted on images  8 through 10 of series 7. Although not conspicuous on the sagittal  images, the appearance is suspicious for degenerative tearing of the  superior labrum.    Patient Stated Goals Improve ROM in neck and reduce pain    Currently in Pain? Yes    Pain Score 4     Pain Location Back    Pain Orientation Lower    Pain Onset More than a month ago                               [facility] Adult PT Treatment/Exercise - 07/26/20 0001       Neck Exercises: Theraband   Shoulder Extension Limitations 3x10 with concentration on core activation      Lumbar Exercises: Stretches   Other Lumbar Stretch Exercise dead lift training and lifitng 10# from raised box - 3x10      Lumbar Exercises: Aerobic   Nustep 5 min L6  Lumbar Exercises: Standing   Other Standing Lumbar Exercises 2x10 paloff press    Other Standing Lumbar Exercises Standing downward chop green TB - 2x10                      PT Short Term Goals - 06/29/20 2331       PT SHORT TERM GOAL #1   Title Pt will be Ind in an initial HEP    Baseline started on eval    Status New    Target Date 07/20/20      PT SHORT TERM GOAL #2   Title Pt will voice understanding of measures to assist in the reduction of low back and LE pain               PT Long Term Goals - 07/17/20 1335       PT LONG TERM GOAL #1   Title Pt will demonstrate bilat hamstring flexibility to 65d with out reproduction of low back and LE pain.    Baseline 60d    Status New      PT LONG TERM GOAL #2   Title Pt will report centralization of bilat LE pain and a low back pain range for 4/10 or less with activities of daily  living      PT LONG TERM GOAL #3   Title Pt's FOTO score will improve to the predicted value of 61%    Baseline 47%    Status New      PT LONG TERM GOAL #4   Title Pt will be Ind in a final HEP to maintain or progress achieved LOF.    Status New      PT LONG TERM GOAL #5   Title Establish work related goals as pt progresses toward RTW      Additional Long Term Goals   Additional Long Term Goals Yes      PT LONG TERM GOAL #6   Title Pt will be able to return to work, not limited by neck pain    Time 4    Period Weeks    Target Date 08/16/20                   Plan - 07/26/20 1031     Clinical Impression Statement Pt is progressing fair with therapy.  We were able to progress to standing core strengthening exercises, but patient remains limited by pain.  Pt shows tendency to flex lumbar spine during hip hinge.  This is corrected with cuing.  We will continue to progress hip hinge as able.    Personal Factors and Comorbidities Past/Current Experience;Time since onset of injury/illness/exacerbation;Comorbidity 1    Comorbidities PTSD    Examination-Activity Limitations Stand;Lift;Bend    Examination-Participation Restrictions Occupation    Stability/Clinical Decision Making Stable/Uncomplicated    Rehab Potential Good    PT Frequency 2x / week    PT Duration 6 weeks    PT Treatment/Interventions ADLs/Self Care Home Management;Cryotherapy;Electrical Stimulation;Ultrasound;Traction;Moist Heat;Iontophoresis 4mg /ml Dexamethasone;Therapeutic activities;Therapeutic exercise;Manual techniques;Patient/family education;Dry needling;Taping;Spinal Manipulations    PT Next Visit Plan Assess reposnse to HEP; progress lumbar strengthening    PT Home Exercise Plan [id number]    Consulted and Agree with Plan of Care Patient             Patient will benefit from skilled therapeutic intervention in order to improve the following deficits and impairments:  Pain,Decreased activity  tolerance,Impaired flexibility  Visit Diagnosis: Neck pain  Chronic  bilateral low back pain with bilateral sciatica  Decreased range of motion of both lower extremities     Problem List Patient Active Problem List   Diagnosis Date Noted   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

## 2020-07-31 ENCOUNTER — Ambulatory Visit: Payer: BC Managed Care – PPO | Admitting: Physical Therapy

## 2020-08-16 ENCOUNTER — Ambulatory Visit: Payer: BC Managed Care – PPO | Admitting: Physical Therapy

## 2021-03-12 ENCOUNTER — Ambulatory Visit (INDEPENDENT_AMBULATORY_CARE_PROVIDER_SITE_OTHER): Payer: Self-pay

## 2021-03-12 ENCOUNTER — Ambulatory Visit (INDEPENDENT_AMBULATORY_CARE_PROVIDER_SITE_OTHER): Payer: BC Managed Care – PPO | Admitting: Adult Health

## 2021-03-12 ENCOUNTER — Encounter: Payer: Self-pay | Admitting: Adult Health

## 2021-03-12 ENCOUNTER — Other Ambulatory Visit: Payer: Self-pay

## 2021-03-12 VITALS — BP 130/80 | HR 79 | Temp 98.0°F | Ht 66.0 in | Wt 148.0 lb

## 2021-03-12 DIAGNOSIS — M25552 Pain in left hip: Secondary | ICD-10-CM

## 2021-03-12 IMAGING — DX DG HIP (WITH OR WITHOUT PELVIS) 2-3V*L*
3 series · 3 of 3 positions shown · non-contrast
Comparison: None.

CLINICAL DATA: Left hip pain for a few months.

EXAM:
DG HIP (WITH OR WITHOUT PELVIS) 2-3V LEFT

[pelvis ap]
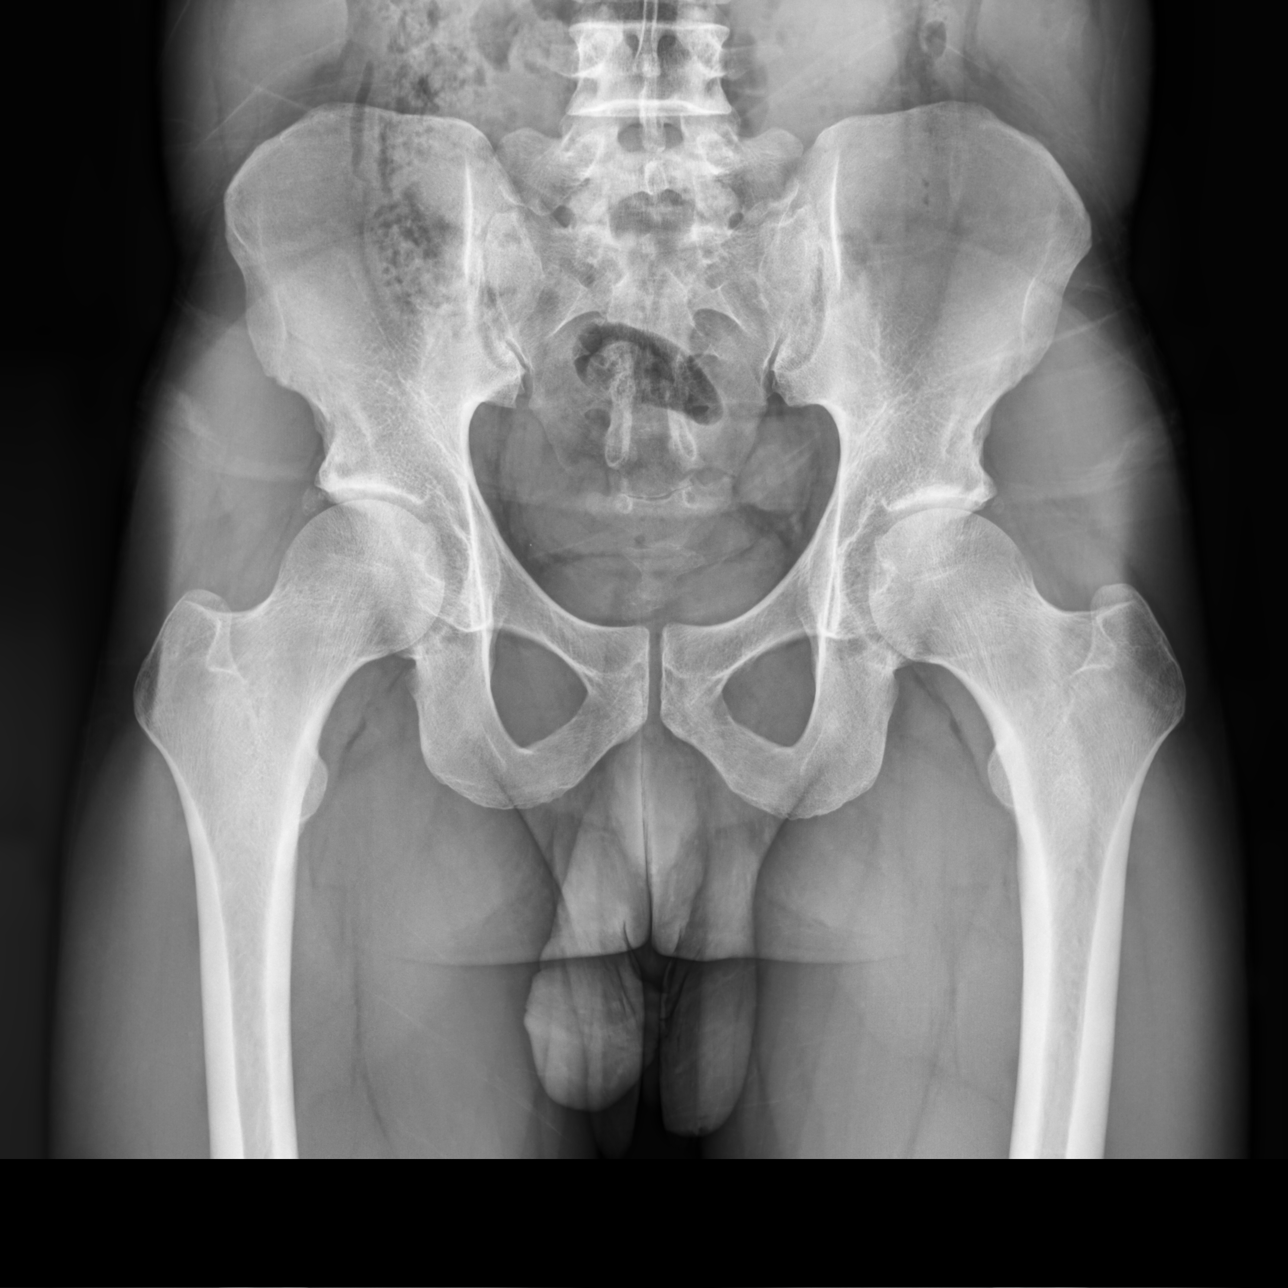

[hip joint ap]
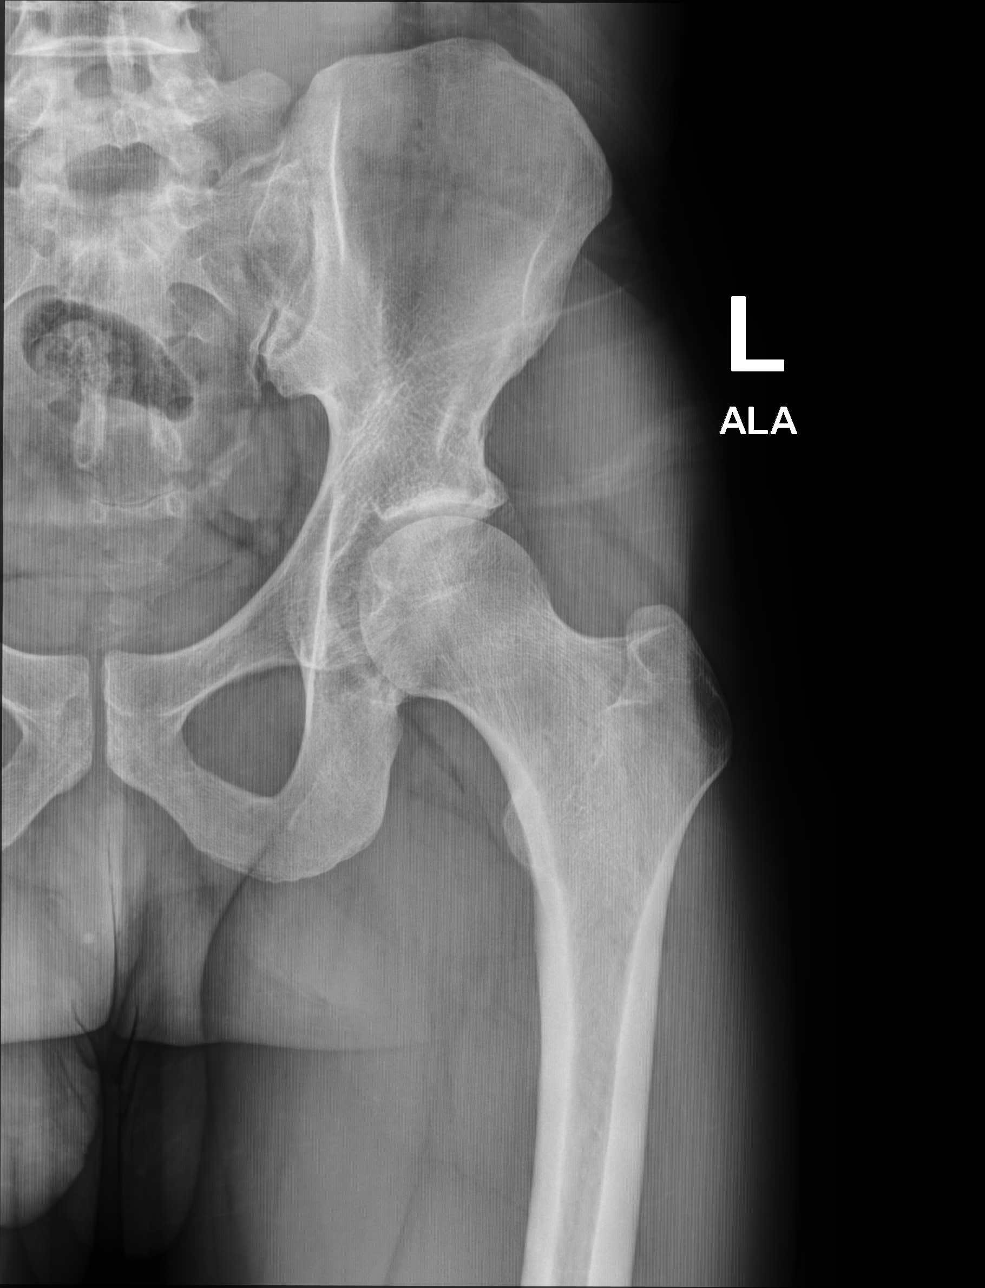

[hip (frog leg) lat]
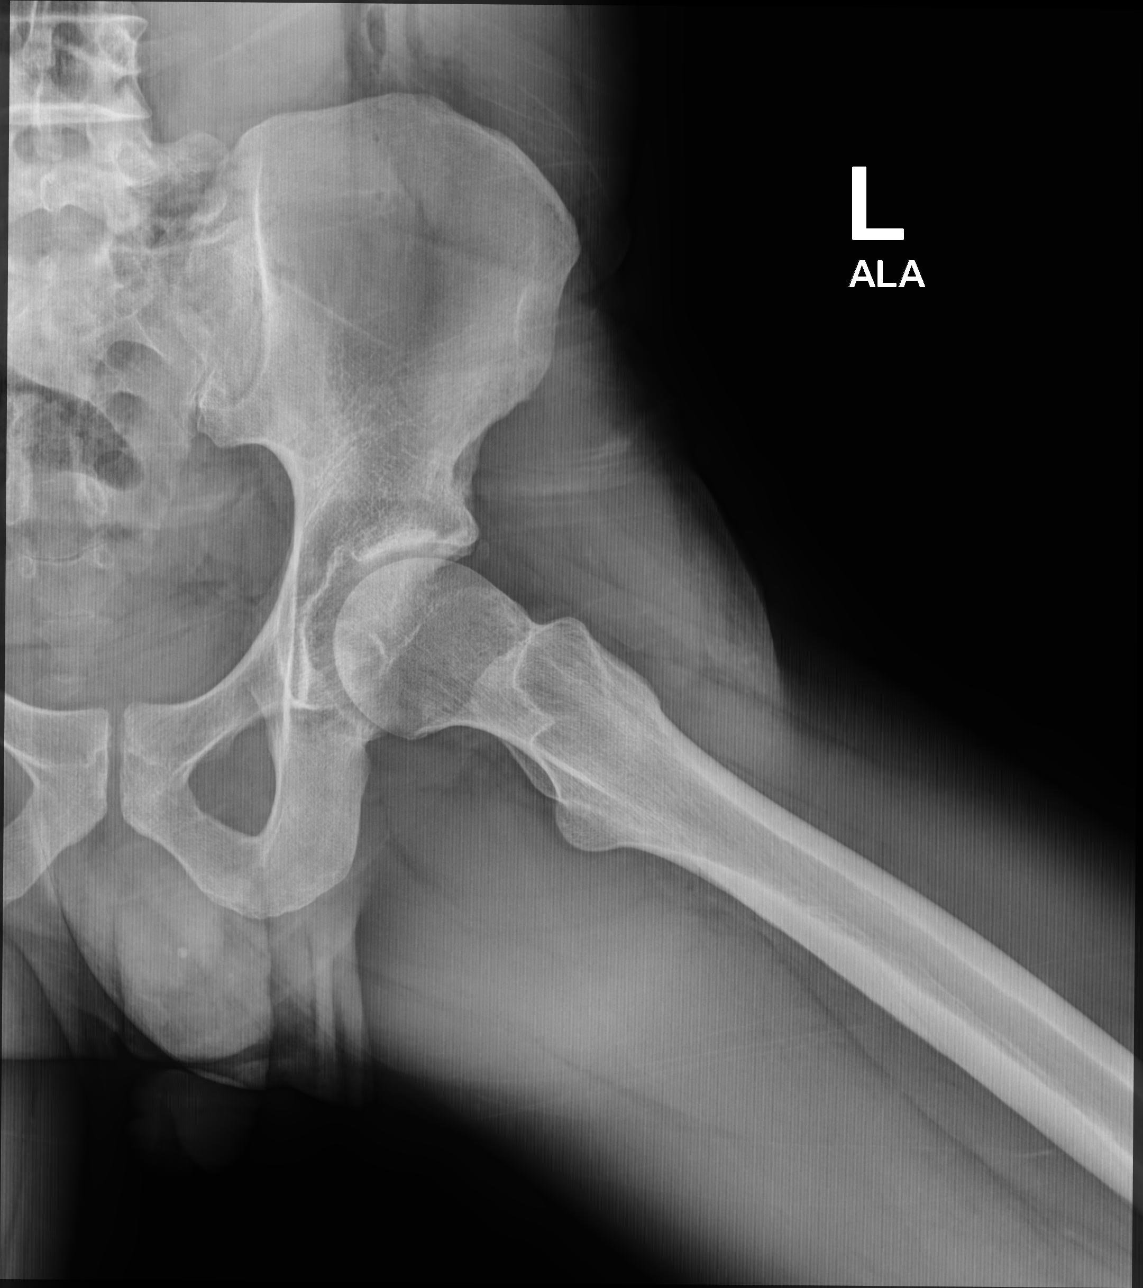

[3 of 3 positions shown; findings below may reference images not displayed]

FINDINGS: There is no evidence of hip fracture or dislocation. There is no
evidence of arthropathy or other focal bone abnormality.
IMPRESSION: Negative.

## 2021-03-12 MED ORDER — MELOXICAM 15 MG PO TABS: 15.0000 mg | ORAL_TABLET | Freq: Every day | ORAL | 0 refills | Status: DC

## 2021-03-12 NOTE — Progress Notes (Signed)
Subjective:    Patient ID: Oscar Mercado, male    DOB: November 22, 1980, 41 y.o.   MRN: [id number]  HPI 41 year old male who  has a past medical history of Carpal tunnel syndrome, Narcolepsy and cataplexy, PTSD (post-traumatic stress disorder), and Stye.  He presents to the office today for the complaint of left hip pain that has been present intermittently over the last few months. He feels as though " it is slipping". More pain and slipping sensation when he is active and over the last month has been more painful in the morning.   He denies trauma or aggravating injury.  Has been using Ibuprofen every few days and feels as though this helps to some degree.      Review of Systems See HPI   Past Medical History:  Diagnosis Date   Carpal tunnel syndrome    bilateral   Narcolepsy and cataplexy    PTSD (post-traumatic stress disorder)    Stye     Social History   Socioeconomic History   Marital status: Single    Spouse name: Not on file   Number of children: Not on file   Years of education: Not on file   Highest education level: Not on file  Occupational History   Not on file  Tobacco Use   Smoking status: Every Day    Types: Cigars   Smokeless tobacco: Never   Tobacco comments:    8 blacks a week  Substance and Sexual Activity   Alcohol use: Yes    Comment: social   Drug use: Yes    Types: Marijuana   Sexual activity: Not on file  Other Topics Concern   Not on file  Social History Narrative   Works in [employer]    Married       Social Determinants of Health   Financial Resource Strain: Not on file  Food Insecurity: Not on file  Transportation Needs: Not on file  Physical Activity: Not on file  Stress: Not on file  Social Connections: Not on file  Intimate Partner Violence: Not on file    Past Surgical History:  Procedure Laterality Date   ORIF MANDIBULAR FRACTURE      Family History  Problem Relation Age of Onset   Diabetes  Maternal Grandmother     No Known Allergies  Current Outpatient Medications on File Prior to Visit  Medication Sig Dispense Refill   methocarbamol (ROBAXIN) 500 MG tablet Take 1 tablet (500 mg total) by mouth 2 (two) times daily as needed. 20 tablet 0   No current facility-administered medications on file prior to visit.    BP 130/80    Pulse 79    Temp 98 F (36.7 C) (Oral)    Ht 5\' 6"  (1.676 m)    Wt 148 lb (67.1 kg)    SpO2 98%    BMI 23.89 kg/m       Objective:   Physical Exam Vitals and nursing note reviewed.  Constitutional:      Appearance: Normal appearance.  Musculoskeletal:        General: Tenderness present.     Left hip: Bony tenderness present. Decreased range of motion. Normal strength.     Comments: Pain in left hip with straight leg raise and internal/external rotation    Skin:    General: Skin is warm and dry.     Capillary Refill: Capillary refill takes less than 2 seconds.  Neurological:  General: No focal deficit present.     Mental Status: He is alert and oriented to person, place, and time.  Psychiatric:        Mood and Affect: Mood normal.        Behavior: Behavior normal.        Thought Content: Thought content normal.        Judgment: Judgment normal.      Assessment & Plan:  1. Left hip pain - Concern for labral tear. Will start with xray and likely need MRI to further assess.  - DG Hip Unilat W OR W/O Pelvis 2-3 Views Left; Future - meloxicam (MOBIC) 15 MG tablet; Take 1 tablet (15 mg total) by mouth daily.  Dispense: 30 tablet; Refill: 0  Shirline Frees, NP

## 2021-03-13 ENCOUNTER — Other Ambulatory Visit: Payer: Self-pay | Admitting: Adult Health

## 2021-03-13 DIAGNOSIS — M25552 Pain in left hip: Secondary | ICD-10-CM

## 2021-03-16 ENCOUNTER — Telehealth: Payer: Self-pay | Admitting: Adult Health

## 2021-03-16 NOTE — Telephone Encounter (Signed)
Pt is calling and need work letter to return back to work 03-17-2021 with no restrictions . Please put on mychart

## 2021-03-17 NOTE — Telephone Encounter (Signed)
Letter has been sent. Pt notified of update. No further action needed!

## 2021-03-17 NOTE — Telephone Encounter (Signed)
Ok for letter

## 2021-03-20 ENCOUNTER — Telehealth: Payer: Self-pay | Admitting: Adult Health

## 2021-03-20 ENCOUNTER — Encounter: Payer: Self-pay | Admitting: Adult Health

## 2021-03-20 ENCOUNTER — Ambulatory Visit (INDEPENDENT_AMBULATORY_CARE_PROVIDER_SITE_OTHER): Payer: Self-pay | Admitting: Adult Health

## 2021-03-20 VITALS — BP 120/80 | HR 89 | Temp 98.9°F | Ht 66.0 in | Wt 148.0 lb

## 2021-03-20 DIAGNOSIS — M25552 Pain in left hip: Secondary | ICD-10-CM

## 2021-03-20 NOTE — Patient Instructions (Addendum)
Please call to schedule your MRI   Address: [address], [place], [place] [address] Phone: [phone]  I am also going to refer you back to see Dr. Roda Shutters

## 2021-03-20 NOTE — Progress Notes (Signed)
Subjective:    Patient ID: Oscar Mercado, male    DOB: 08-26-1980, 41 y.o.   MRN: [id number]  HPI 41 year old male who  has a past medical history of Carpal tunnel syndrome, Narcolepsy and cataplexy, PTSD (post-traumatic stress disorder), and Stye.  He presents to the office today for follow up regarding left pain. He was last seen for this issue 7 days ago. His xray was negative. There is concern of labral tear and an MRI was ordered. He has not been scheduled yet.   He reports that over the last week he continues to feel stiffness with standing and feels as though the slipping is happening more frequently.   Is using Meloxicam but does not feel like it lasts all day   He needs a note for work until he has his MRI    Review of Systems See HPI   Past Medical History:  Diagnosis Date   Carpal tunnel syndrome    bilateral   Narcolepsy and cataplexy    PTSD (post-traumatic stress disorder)    Stye     Social History   Socioeconomic History   Marital status: Single    Spouse name: Not on file   Number of children: Not on file   Years of education: Not on file   Highest education level: Not on file  Occupational History   Not on file  Tobacco Use   Smoking status: Every Day    Types: Cigars   Smokeless tobacco: Never   Tobacco comments:    8 blacks a week  Substance and Sexual Activity   Alcohol use: Yes    Comment: social   Drug use: Yes    Types: Marijuana   Sexual activity: Not on file  Other Topics Concern   Not on file  Social History Narrative   Works in [employer]    Married       Social Determinants of Health   Financial Resource Strain: Not on file  Food Insecurity: Not on file  Transportation Needs: Not on file  Physical Activity: Not on file  Stress: Not on file  Social Connections: Not on file  Intimate Partner Violence: Not on file    Past Surgical History:  Procedure Laterality Date   ORIF MANDIBULAR FRACTURE       Family History  Problem Relation Age of Onset   Diabetes Maternal Grandmother     No Known Allergies  Current Outpatient Medications on File Prior to Visit  Medication Sig Dispense Refill   meloxicam (MOBIC) 15 MG tablet Take 1 tablet (15 mg total) by mouth daily. 30 tablet 0   methocarbamol (ROBAXIN) 500 MG tablet Take 1 tablet (500 mg total) by mouth 2 (two) times daily as needed. 20 tablet 0   No current facility-administered medications on file prior to visit.    BP 120/80    Pulse 89    Temp 98.9 F (37.2 C) (Oral)    Ht 5\' 6"  (1.676 m)    Wt 148 lb (67.1 kg)    SpO2 95%    BMI 23.89 kg/m       Objective:   Physical Exam Vitals and nursing note reviewed.  Constitutional:      Appearance: Normal appearance.  Musculoskeletal:        General: Tenderness present.     Left hip: Decreased range of motion.  Skin:    General: Skin is warm and dry.  Neurological:  General: No focal deficit present.     Mental Status: He is alert and oriented to person, place, and time.  Psychiatric:        Mood and Affect: Mood normal.        Behavior: Behavior normal.        Thought Content: Thought content normal.        Judgment: Judgment normal.      Assessment & Plan:   1. Left hip pain - Can take tylenol - Phone number given to call and schedule his MRI  - Will refer to orthopedics - Work note provided - Ambulatory referral to [place], NP

## 2021-03-20 NOTE — Telephone Encounter (Signed)
Pt is calling and he called Phone: [phone] Address: [address] [place], [place] [address] to sch MRI and per pt was told they are not scheduling MRI yet. Pt would like to go to another location. Pt was seen today

## 2021-03-24 ENCOUNTER — Other Ambulatory Visit: Payer: Self-pay | Admitting: Adult Health

## 2021-03-24 DIAGNOSIS — M25552 Pain in left hip: Secondary | ICD-10-CM

## 2021-03-24 NOTE — Telephone Encounter (Signed)
Patient notified of update  and verbalized understanding. 

## 2021-03-27 ENCOUNTER — Encounter: Payer: Self-pay | Admitting: Adult Health

## 2021-03-27 ENCOUNTER — Ambulatory Visit (INDEPENDENT_AMBULATORY_CARE_PROVIDER_SITE_OTHER): Payer: Self-pay | Admitting: Adult Health

## 2021-03-27 ENCOUNTER — Ambulatory Visit (INDEPENDENT_AMBULATORY_CARE_PROVIDER_SITE_OTHER): Payer: Self-pay | Admitting: Orthopaedic Surgery

## 2021-03-27 ENCOUNTER — Encounter: Payer: Self-pay | Admitting: Orthopaedic Surgery

## 2021-03-27 ENCOUNTER — Other Ambulatory Visit: Payer: Self-pay

## 2021-03-27 VITALS — Ht 66.0 in | Wt 148.0 lb

## 2021-03-27 VITALS — BP 114/70 | HR 75 | Temp 98.9°F | Wt 152.4 lb

## 2021-03-27 DIAGNOSIS — M25552 Pain in left hip: Secondary | ICD-10-CM

## 2021-03-27 NOTE — Progress Notes (Signed)
Subjective:    Patient ID: Oscar Mercado, male    DOB: 1980/10/07, 41 y.o.   MRN: [id number]  HPI  41 year old male who  has a past medical history of Carpal tunnel syndrome, Narcolepsy and cataplexy, PTSD (post-traumatic stress disorder), and Stye.  He presents to the office today for follow up regarding pain in his left hip. He was seen by orthopedics earlier today who referred the patient to Dr. Alvester Morin for diagnostic and therapeutic cortisone injection for suspected superior labral tear of the left hip.  He needs term disability paperwork filled out  Review of Systems See HPI   Past Medical History:  Diagnosis Date   Carpal tunnel syndrome    bilateral   Narcolepsy and cataplexy    PTSD (post-traumatic stress disorder)    Stye     Social History   Socioeconomic History   Marital status: Single    Spouse name: Not on file   Number of children: Not on file   Years of education: Not on file   Highest education level: Not on file  Occupational History   Not on file  Tobacco Use   Smoking status: Every Day    Types: Cigars   Smokeless tobacco: Never   Tobacco comments:    8 blacks a week  Substance and Sexual Activity   Alcohol use: Yes    Comment: social   Drug use: Yes    Types: Marijuana   Sexual activity: Not on file  Other Topics Concern   Not on file  Social History Narrative   Works in [employer]    Married       Social Determinants of Health   Financial Resource Strain: Not on file  Food Insecurity: Not on file  Transportation Needs: Not on file  Physical Activity: Not on file  Stress: Not on file  Social Connections: Not on file  Intimate Partner Violence: Not on file    Past Surgical History:  Procedure Laterality Date   ORIF MANDIBULAR FRACTURE      Family History  Problem Relation Age of Onset   Diabetes Maternal Grandmother     No Known Allergies  Current Outpatient Medications on File Prior to Visit   Medication Sig Dispense Refill   meloxicam (MOBIC) 15 MG tablet Take 1 tablet (15 mg total) by mouth daily. 30 tablet 0   methocarbamol (ROBAXIN) 500 MG tablet Take 1 tablet (500 mg total) by mouth 2 (two) times daily as needed. 20 tablet 0   No current facility-administered medications on file prior to visit.    BP 114/70 (BP Location: Right Arm, Patient Position: Sitting, Cuff Size: Normal)    Pulse 75    Temp 98.9 F (37.2 C) (Oral)    Wt 152 lb 6.4 oz (69.1 kg)    SpO2 96%    BMI 24.60 kg/m       Objective:   Physical Exam Vitals and nursing note reviewed.  Constitutional:      Appearance: Normal appearance.  Musculoskeletal:        General: Tenderness present.  Skin:    General: Skin is warm and dry.  Neurological:     General: No focal deficit present.     Mental Status: He is alert. Mental status is at baseline. He is disoriented.     Gait: Gait abnormal (limping gait).  Psychiatric:        Mood and Affect: Mood normal.  Behavior: Behavior normal.        Thought Content: Thought content normal.        Judgment: Judgment normal.      Assessment & Plan:   1. Left hip pain -Follow-up with Dr. Alvester Morin as directed.   Will fill out short-term disability paper work   Shirline Frees, NP
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
## 2021-04-06 ENCOUNTER — Ambulatory Visit (INDEPENDENT_AMBULATORY_CARE_PROVIDER_SITE_OTHER): Payer: 59 | Admitting: Physical Medicine and Rehabilitation

## 2021-04-06 ENCOUNTER — Ambulatory Visit: Payer: Self-pay

## 2021-04-06 ENCOUNTER — Encounter: Payer: Self-pay | Admitting: Physical Medicine and Rehabilitation

## 2021-04-06 ENCOUNTER — Other Ambulatory Visit: Payer: Self-pay

## 2021-04-06 DIAGNOSIS — M25552 Pain in left hip: Secondary | ICD-10-CM

## 2021-04-06 NOTE — Progress Notes (Signed)
Pt state left hip pain. Pt state walking, running and standing makes the pain worse. Pt state he takes pain meds and uses heat/ ice to help ease his pain.  Numeric Pain Rating Scale and Functional Assessment Average Pain 7   In the last MONTH (on 0-10 scale) has pain interfered with the following?  1. General activity like being  able to carry out your everyday physical activities such as walking, climbing stairs, carrying groceries, or moving a chair?  Rating(9)    -BT, -Dye Allergies.

## 2021-04-06 NOTE — Progress Notes (Signed)
Oscar Mercado - 41 y.o. male MRN [id number]  Date of birth: 03/15/1980  Office Visit Note: Visit Date: 04/06/2021 PCP: Dorothyann Peng, NP Referred by: Dorothyann Peng, NP  Subjective: Chief Complaint  Patient presents with   Left Hip - Pain   HPI:  Oscar Mercado is a 41 y.o. male who comes in today at the request of Dr. Eduard Roux for planned Left anesthetic hip arthrogram with fluoroscopic guidance.  The patient has failed conservative care including home exercise, medications, time and activity modification.  This injection will be diagnostic and hopefully therapeutic.  Please see requesting physician notes for further details and justification.   ROS Otherwise per HPI.  Assessment & Plan: Visit Diagnoses:    ICD-10-CM   1. Pain in left hip  M25.552 XR C-ARM NO REPORT    Large Joint Inj: L hip joint      Plan: No additional findings.   Meds & Orders: No orders of the defined types were placed in this encounter.   Orders Placed This Encounter  Procedures   Large Joint Inj: L hip joint   XR C-ARM NO REPORT    Follow-up: Return if symptoms worsen or fail to improve.   Procedures: Large Joint Inj: L hip joint on 04/06/2021 10:22 AM Indications: diagnostic evaluation and pain Details: 22 G 3.5 in needle, fluoroscopy-guided anterior approach  Arthrogram: No  Medications: 4 mL bupivacaine 0.25 %; 60 mg triamcinolone acetonide 40 MG/ML Outcome: tolerated well, no immediate complications  There was excellent flow of contrast producing a partial arthrogram of the hip. The patient did have relief of symptoms during the anesthetic phase of the injection. Procedure, treatment alternatives, risks and benefits explained, specific risks discussed. Consent was given by the patient. Immediately prior to procedure a time out was called to verify the correct patient, procedure, equipment, support staff and site/side marked as required. Patient was prepped and draped in the usual  sterile fashion.         Clinical History: MRI LUMBAR SPINE WITHOUT CONTRAST   TECHNIQUE: Multiplanar, multisequence MR imaging of the lumbar spine was performed. No intravenous contrast was administered.   COMPARISON:  Prior radiograph from 05/06/2020.   FINDINGS: Segmentation: Standard. Lowest well-formed disc space labeled the L5-S1 level.   Alignment: Trace dextroscoliosis. Alignment otherwise normal with preservation of the normal lumbar lordosis. No listhesis.   Vertebrae: Vertebral body height maintained without acute or chronic fracture. Bone marrow signal intensity within normal limits. No discrete or worrisome osseous lesions. No abnormal marrow edema.   Conus medullaris and cauda equina: Conus extends to the T12 level. Conus and cauda equina appear normal.   Paraspinal and other soft tissues: Unremarkable.   Disc levels:   L1-2:  Unremarkable.   L2-3:  Unremarkable.   L3-4:  Unremarkable.   L4-5: Mild broad-based posterior disc bulge, slightly asymmetric to the right. Mild facet hypertrophy. Resultant mild narrowing of the lateral recesses bilaterally. Central canal remains patent. No significant foraminal stenosis.   L5-S1:  Unremarkable.   IMPRESSION: 1. Mild broad-based posterior disc bulge and facet hypertrophy at L4-5 with resultant mild bilateral lateral recess stenosis. No frank neural impingement. 2. Otherwise essentially normal MRI of the lumbar spine.     Electronically Signed   By: Jeannine Boga M.D.   On: 05/25/2020 03:38     Objective:  VS:  HT:     WT:    BMI:      BP:    HR: bpm  TEMP: ( )   RESP:  Physical Exam   Imaging: No results found.

## 2021-04-14 MED ORDER — TRIAMCINOLONE ACETONIDE 40 MG/ML IJ SUSP
60.0000 mg | INTRAMUSCULAR | Status: AC | PRN
  Administered 2021-04-06: 60 mg via INTRA_ARTICULAR

## 2021-04-14 MED ORDER — BUPIVACAINE HCL 0.25 % IJ SOLN
4.0000 mL | INTRAMUSCULAR | Status: AC | PRN
  Administered 2021-04-06: 4 mL via INTRA_ARTICULAR

## 2021-04-21 ENCOUNTER — Encounter: Payer: Self-pay | Admitting: Orthopaedic Surgery

## 2021-04-21 ENCOUNTER — Ambulatory Visit (INDEPENDENT_AMBULATORY_CARE_PROVIDER_SITE_OTHER): Payer: 59 | Admitting: Orthopaedic Surgery

## 2021-04-21 ENCOUNTER — Ambulatory Visit: Payer: 59 | Admitting: Adult Health

## 2021-04-21 ENCOUNTER — Other Ambulatory Visit: Payer: Self-pay

## 2021-04-21 DIAGNOSIS — M25552 Pain in left hip: Secondary | ICD-10-CM

## 2021-04-21 NOTE — Progress Notes (Signed)
Office Visit Note   Patient: Oscar Mercado           Date of Birth: Apr 19, 1980           MRN: [id number] Visit Date: 04/21/2021              Requested by: Dorothyann Peng, NP [place],   [id number] PCP: Dorothyann Peng, NP   Assessment & Plan: Visit Diagnoses:  1. Pain in left hip     Plan: [name] returns today to follow-up for left hip pain.  He has noticed some improvement from the intra-articular injection that Dr. Ernestina Patches did couple weeks ago.  He has pain when he stands at work.  He feels a slipping sensation like it wants to give out.  He coaches youth basketball as well.  Examination of the left hip is unchanged and stable.  We had a long discussion today about treatment options including hip arthroscopy versus continued conservative management with medications and a course of outpatient physical therapy.  He would like to go the conservative route for now.  I placed a referral for PT at the [facility] location.  He requested to be out of work for a month because he has trouble with prolonged standing.  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  Total face to face encounter time was greater than 25 minutes and over half of this time was spent in counseling and/or coordination of care.  Follow-Up Instructions: No follow-ups on file.   Orders:  Orders Placed This Encounter  Procedures   Ambulatory referral to Physical Therapy   No orders of the defined types were placed in this encounter.     Procedures: No procedures performed   Clinical Data: No additional findings.   Subjective: Chief Complaint  Patient presents with   Left Hip - Pain    HPI  Review of Systems  Constitutional: Negative.   All other systems reviewed and are negative.   Objective: Vital Signs: There were no vitals taken for this visit.  Physical Exam Vitals and nursing note reviewed.  Constitutional:      Appearance: He is  well-developed.  Pulmonary:     Effort: Pulmonary effort is normal.  Abdominal:     Palpations: Abdomen is soft.  Skin:    General: Skin is warm.  Neurological:     Mental Status: He is alert and oriented to person, place, and time.  Psychiatric:        Behavior: Behavior normal.        Thought Content: Thought content normal.        Judgment: Judgment normal.    Ortho Exam  Specialty Comments:  No specialty comments available.  Imaging: No results found.   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   Daytime somnolence 11/01/2018   Past Medical History:  Diagnosis Date   Carpal tunnel syndrome    bilateral   Narcolepsy and cataplexy    PTSD (post-traumatic stress disorder)    Stye     Family History  Problem Relation Age of Onset   Diabetes Maternal Grandmother     Past Surgical History:  Procedure Laterality Date   ORIF MANDIBULAR FRACTURE     Social History   Occupational History   Not on file  Tobacco Use   Smoking status: Every Day    Types: Cigars  Smokeless tobacco: Never   Tobacco comments:    8 blacks a week  Substance and Sexual Activity   Alcohol use: Yes    Comment: social   Drug use: Yes    Types: Marijuana   Sexual activity: Not on file

## 2021-04-25 ENCOUNTER — Other Ambulatory Visit: Payer: 59

## 2021-04-29 ENCOUNTER — Encounter: Payer: Self-pay | Admitting: Adult Health

## 2021-05-05 ENCOUNTER — Telehealth (INDEPENDENT_AMBULATORY_CARE_PROVIDER_SITE_OTHER): Payer: 59 | Admitting: Adult Health

## 2021-05-05 ENCOUNTER — Other Ambulatory Visit: Payer: Self-pay

## 2021-05-05 ENCOUNTER — Encounter: Payer: Self-pay | Admitting: Adult Health

## 2021-05-05 VITALS — Ht 66.0 in | Wt 152.0 lb

## 2021-05-05 DIAGNOSIS — G47411 Narcolepsy with cataplexy: Secondary | ICD-10-CM | POA: Diagnosis not present
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
## 2021-05-09 ENCOUNTER — Other Ambulatory Visit: Payer: Self-pay

## 2021-05-09 ENCOUNTER — Ambulatory Visit: Payer: 59 | Attending: Orthopaedic Surgery | Admitting: Physical Therapy

## 2021-05-09 ENCOUNTER — Encounter: Payer: Self-pay | Admitting: Physical Therapy

## 2021-05-09 DIAGNOSIS — M6281 Muscle weakness (generalized): Secondary | ICD-10-CM | POA: Diagnosis present

## 2021-05-09 DIAGNOSIS — M542 Cervicalgia: Secondary | ICD-10-CM | POA: Insufficient documentation

## 2021-05-09 DIAGNOSIS — M25552 Pain in left hip: Secondary | ICD-10-CM | POA: Diagnosis not present

## 2021-05-09 NOTE — Therapy (Signed)
?OUTPATIENT PHYSICAL THERAPY LOWER EXTREMITY EVALUATION ? ? ?Patient Name: Oscar Mercado ?MRN: [id number] ?DOB:22-Mar-1980, 41 y.o., male ?Today's Date: 05/09/2021 ? ? PT End of Session - 05/09/21 1209   ? ? Visit Number 1   ? Date for PT Re-Evaluation 07/04/21   ? Authorization Type [employer]   ? PT Start Time [id number]   ? PT Stop Time 1025   ? PT Time Calculation (min) 40 min   ? ?  ?  ? ?  ? ? ?Past Medical History:  ?Diagnosis Date  ? Carpal tunnel syndrome   ? bilateral  ? Narcolepsy and cataplexy   ? PTSD (post-traumatic stress disorder)   ? Stye   ? ?Past Surgical History:  ?Procedure Laterality Date  ? ORIF MANDIBULAR FRACTURE    ? ?Patient Active Problem List  ? Diagnosis Date Noted  ? 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 Daytime somnolence 11/01/2018  ? ? ?PCP: Dorothyann Peng, NP ? ?REFERRING PROVIDER: Leandrew Koyanagi, MD ? ?REFERRING DIAG: L hip pain ? ?THERAPY DIAG:  ?Pain in left hip ? ?Muscle weakness ? ?ONSET DATE: end of 2022 ? ?SUBJECTIVE:                                                                                                                                                                                          ? ?MOI/History of condition:  ? ?Oscar Mercado is a 41 y.o. male who presents to clinic with chief complaint of L lateral hip pain, he feels like his hip may go out.  He self reports a L labral tear (confirmed on MRI ~ 1 year ago).  Pain with lying on L side. 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  He reports some clicking and popping and "slipping". ? ? Red flags:  ?denies  ? ?Pertinent past history:  ?Cataplexy, narcolepsy?, PTSD ? ?Pain:  ?Are you having pain? Yes ?Pain location: diffuse lateral hip pain ?NPRS scale:  ?highest 10/10 ?current 3/10  ?best 2/10 ?Aggravating factors: standing (45 min), walking for prolonged periods ?Relieving factors: sitting rest ?Pain description: sharp and aching ?Severity: high ?Irritability:  low ?Stage: Chronic ?Stability: staying the same ? 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 ?Hobbies/Recreation: coaches youth sports ? ?Assistive Device: NA ? ?Hand Dominance: NA ? ?Patient Goals: reduce pain and avoid surgery ? ? ?PRECAUTIONS: None ? ?WEIGHT BEARING RESTRICTIONS No ? ?FALLS:  ?Has patient fallen in last 6 months? No, Number of falls: 0 ? ?LIVING ENVIRONMENT: ?Lives with: lives with their family ?Stairs: No;  ? ?PLOF: Independent ? ?DIAGNOSTIC  FINDINGS:  ? ?CLINICAL DATA:  Chronic left hip pain ?  ?EXAM: ?MR OF THE LEFT HIP WITHOUT CONTRAST ?  ?TECHNIQUE: ?Multiplanar, multisequence MR imaging was performed. No intravenous ?contrast was administered. ?  ?COMPARISON:  Radiographs 05/06/2020 ?  ?FINDINGS: ?Bones: Unremarkable ?  ?Articular cartilage and labrum ?  ?Articular cartilage:  Unremarkable ?  ?Labrum: Longitudinal irregularity in the superior labrum noted on ?images 8 through 10 of series 7. Although not conspicuous on the ?sagittal images, the appearance on coronal images is suspicious for ?degenerative tearing of the superior labrum. ?  ?Joint or bursal effusion ?  ?Joint effusion:  Absent ?  ?Bursae: No regional bursitis ?  ?Muscles and tendons ?  ?Muscles and tendons: Unremarkable. No correlated findings for ?athletic pubalgia. ?  ?Other findings ?  ?Miscellaneous:   No supplemental non-categorized findings. ?  ?IMPRESSION: ?1. Longitudinal irregularity in the superior labrum noted on images ?8 through 10 of series 7. Although not conspicuous on the sagittal ?images, the appearance is suspicious for degenerative tearing of the ?superior labrum. ? ? ?OBJECTIVE:  ? ?SENSATION: ? Light touch: Appears intact ? ?PALPATION: ? ? ?MUSCLE LENGTH: ?Hamstrings: Right 90 AROM deg; Left 45 AROM P! ? ?LE MMT: ? ?MMT Right ?05/09/2021 Left ?05/09/2021  ?Hip flexion (L2, L3) 4 3*  ?Knee extension (L3)    ?Knee flexion    ?Hip abduction  3-*  ?Hip extension  3-*  ?Hip external rotation  4+ 4+  ?Hip internal rotation 4+ 3+*  ?Hip adduction    ?Ankle dorsiflexion (L4)    ?Ankle plantarflexion (S1)    ?Ankle inversion    ?Ankle eversion    ?Great Toe ext (L5)    ?Grossly    ? ?(Blank rows = not tested, score listed is out of 5 possible points.  N = WNL, D = diminished, C = clear for gross weakness with myotome testing, * = concordant pain with testing) ? ?LE ROM: ? ?ROM Right ?05/09/2021 Left ?05/09/2021  ?Hip flexion N 90*  ?Hip extension    ?Hip abduction    ?Hip adduction    ?Hip internal rotation N N*  ?Hip external rotation N N  ?Knee flexion    ?Knee extension    ?Ankle dorsiflexion    ?Ankle plantarflexion    ?Ankle inversion    ?Ankle eversion    ? ?(Blank rows = not tested, N = WNL, * = concordant pain with testing) ? ?FUNCTIONAL TESTS:  ?SLS: R: 30'' L 30'' with P! ? ?Special tests: FABER, FADIR, manual hip flexion all (+) for lateral hip pain ? ?PATIENT SURVEYS:  ?LEFS next visit ? ? ? ?TODAY'S TREATMENT: ?Creating, reviewing, and completing below HEP ? ? ?PATIENT EDUCATION:  ?POC, diagnosis, prognosis, HEP, and outcome measures.  Pt educated via explanation, demonstration, and handout (HEP).  Pt confirms understanding verbally.  ? ?ASTERISK SIGNS ? ? ?Asterisk Signs        ?Hip abd and ext MMT        ?30'' SLS        ?Walking tolerance        ?        ?        ? ? ?HOME EXERCISE PROGRAM: ? ?Access Code: [email or web address] ?URL: [email or web address] ?Date: 05/09/2021 ?Prepared by: Shearon Balo ? ?Exercises ? ?Access Code: [email or web address] ?URL: [email or web address] ?Date: 05/09/2021 ?Prepared by: Shearon Balo ? ?Exercises ?Hooklying Bilateral Isometric Clamshell - 1 x daily - 7 x weekly - 3 sets - 10 reps -  10'' hold ?Supine Bridge - 1 x daily - 7 x weekly - 3 sets - 10 reps ? ? ? ?ASSESSMENT: ? ?CLINICAL IMPRESSION: ?Oscar Mercado is a 41 y.o. male who presents to clinic with signs and sxs consistent with L sided hip pain secondary to GTPS.  He is (+) for interarticular hip  special tests, but reproduction of pain is to lateral hip vs groin.  Given point tenderness over GT and aggs being mostly standing/walking for long periods GTPS is ruled up as pain generator.  I will contact MD and see if a GT injection is possible ? ?OBJECTIVE IMPAIRMENTS: Pain, limited hip ROM, hip and LE muscle weakness ? ?ACTIVITY LIMITATIONS: walking, standing, squatting, participating fully in coaching activities ? ?PERSONAL FACTORS: See medical history and pertinent history ? ? ?REHAB POTENTIAL: Good ? ?CLINICAL DECISION MAKING: Stable/uncomplicated ? ?EVALUATION COMPLEXITY: Low ? ? ?GOALS: ? ? ?SHORT TERM GOALS: ? ?Deran will be >75% HEP compliant to improve carryover between sessions and facilitate independent management of condition ? ?Evaluation (05/09/2021): ongoing ?Target date: 07/04/2021 ?Goal status: INITIAL ? ? ?LONG TERM GOALS: ? ?Hoyle will be able to stand for 4 hours, not limited by pain  ? ?Evaluation/Baseline (05/09/2021): 30 min ?Target date: 07/04/2021 ?Goal status: INITIAL ? ? ?2.  Vaughan will improve the following MMTs to >/= 4/5 to show improvement in strength:  hip ext, hip abd  ? ?Evaluation/Baseline (05/09/2021): see chart in note ?Target date: 07/04/2021 ?Goal status: INITIAL ? ? ?3.  Kassian will self report >/= 50% decrease in pain from evaluation  ? ?Evaluation/Baseline (05/09/2021): 10/10 max pain ?Target date: 07/04/2021 ?Goal status: INITIAL ? ? ?4.  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 ?Evaluation/Baseline (05/09/2021): limited ?Target date: 07/04/2021 ?Goal status: INITIAL ? ? ?5.  LEFS ? ? ? ? ?PLAN: ?PT FREQUENCY: 1-2x/week ? ?PT DURATION: 8 weeks (Ending 07/04/2021) ? ?PLANNED INTERVENTIONS: Therapeutic exercises, Aquatic therapy, Therapeutic activity, Neuro Muscular re-education, Gait training, Patient/Family education, Joint mobilization, Dry Needling, Electrical stimulation, Spinal mobilization and/or manipulation, Moist heat,  Taping, Vasopneumatic device, Ionotophoresis 4mg /ml Dexamethasone, and Manual therapy ? ?PLAN FOR NEXT SESSION: progressive glute strengthening, TND ? ? ?Shearon Balo PT, DPT ?05/09/2021, 12:10 PM ?

## 2021-05-12 ENCOUNTER — Encounter: Payer: 59 | Admitting: Physical Therapy

## 2021-05-14 NOTE — Therapy (Incomplete)
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  PERTINENT HISTORY: Cataplexy, narcolepsy?, PTSD  PRECAUTIONS: None  SUBJECTIVE: ***  PAIN:  Are you having pain? Yes Pain location: diffuse lateral hip pain NPRS scale:  highest 10/10 current 3/10  best 2/10 Aggravating factors: standing (45 min), walking for prolonged periods Relieving factors: sitting rest Pain description: sharp and aching Severity: high Irritability: low Stage: Chronic Stability: staying the same  Patient Goals: reduce pain and avoid surgery   OBJECTIVE:  MUSCLE LENGTH: Hamstrings: Right 90 AROM deg; Left 45 AROM P!   LE MMT:   MMT Right 05/09/2021 Left 05/09/2021  Hip flexion (L2, L3) 4 3*  Knee extension (L3)      Knee flexion      Hip abduction   3-*  Hip extension   3-*  Hip external rotation 4+ 4+  Hip internal rotation 4+ 3+*  Hip adduction      Ankle dorsiflexion (L4)      Ankle plantarflexion (S1)      Ankle inversion      Ankle eversion      Great Toe ext (L5)      Grossly       (Blank rows = not tested, score listed is out of 5 possible points.  N = WNL, D = diminished, C = clear for gross weakness with myotome testing, * = concordant pain with testing)   LE ROM:   ROM Right 05/09/2021 Left 05/09/2021  Hip  flexion N 90*  Hip extension      Hip abduction      Hip adduction      Hip internal rotation N N*  Hip external rotation N N  Knee flexion      Knee extension      Ankle dorsiflexion      Ankle plantarflexion      Ankle inversion      Ankle eversion        (Blank rows = not tested, N = WNL, * = concordant pain with testing)   FUNCTIONAL TESTS:  SLS: R: 30'' L 30'' with P!   Special tests: FABER, FADIR, manual hip flexion all (+) for lateral hip pain   PATIENT SURVEYS:  LEFS next visit      TODAY'S TREATMENT: [facility] Adult PT Treatment:                                                DATE: 05/16/2021 Therapeutic Exercise: *** Manual Therapy: *** Neuromuscular re-ed: ***     PATIENT EDUCATION:  HEP, and outcome measures.  Pt educated via explanation, demonstration, and handout (HEP).  Pt  confirms understanding verbally.    ASTERISK SIGNS     Asterisk Signs              Hip abd and ext MMT              30'' SLS              Walking tolerance                                              HOME EXERCISE PROGRAM: Access Code: [email or web address]      ASSESSMENT: CLINICAL IMPRESSION: Patient tolerated therapy well with no adverse effects. *** Patient would benefit from continued skilled PT to progress mobility and strength in order to reduce pain and maximize functional ability.   Oscar Mercado is a 41 y.o. male who presents to clinic with signs and sxs consistent with L sided hip pain secondary to GTPS.  He is (+) for interarticular hip special tests, but reproduction of pain is to lateral hip vs groin.  Given point tenderness over GT and aggs being mostly standing/walking for long periods GTPS is ruled up as pain generator.  I will contact MD and see if a GT injection is possible   OBJECTIVE IMPAIRMENTS: Pain, limited hip ROM, hip and LE muscle weakness   ACTIVITY LIMITATIONS: walking, standing, squatting, participating fully in coaching activities   PERSONAL FACTORS: See  medical history and pertinent history     GOALS: SHORT TERM GOALS:   Oscar Mercado will be >75% HEP compliant to improve carryover between sessions and facilitate independent management of condition Evaluation (05/09/2021): ongoing Target date: 07/04/2021 Goal status: INITIAL    LONG TERM GOALS:   Oscar Mercado will be able to stand for 4 hours, not limited by pain  Evaluation/Baseline (05/09/2021): 30 min Target date: 07/04/2021 Goal status: INITIAL     2.  Oscar Mercado will improve the following MMTs to >/= 4/5 to show improvement in strength:  hip ext, hip abd  Evaluation/Baseline (05/09/2021): see chart in note Target date: 07/04/2021 Goal status: INITIAL     3.  Oscar Mercado will self report >/= 50% decrease in pain from evaluation  Evaluation/Baseline (05/09/2021): 10/10 max pain Target date: 07/04/2021 Goal status: INITIAL     4.  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  LEFS      PLAN: PT FREQUENCY: 1-2x/week   PT DURATION: 8 weeks (Ending 07/04/2021)   PLANNED INTERVENTIONS: Therapeutic exercises, Aquatic therapy, Therapeutic activity, Neuro Muscular re-education, Gait training, Patient/Family education, Joint mobilization, Dry Needling, Electrical stimulation, Spinal mobilization and/or manipulation, Moist heat, Taping, Vasopneumatic device, Ionotophoresis 4mg /ml Dexamethasone, and Manual therapy   PLAN FOR NEXT SESSION: progressive glute strengthening, TND   , PT, DPT, LAT, ATC 05/15/21  12:33 PM Phone: [phone] Fax: [phone]

## 2021-05-16 ENCOUNTER — Encounter: Payer: Self-pay | Admitting: Physical Therapy

## 2021-05-16 ENCOUNTER — Ambulatory Visit: Payer: 59 | Admitting: Physical Therapy

## 2021-05-16 ENCOUNTER — Other Ambulatory Visit: Payer: Self-pay

## 2021-05-16 DIAGNOSIS — M542 Cervicalgia: Secondary | ICD-10-CM

## 2021-05-16 DIAGNOSIS — M6281 Muscle weakness (generalized): Secondary | ICD-10-CM

## 2021-05-16 DIAGNOSIS — M25552 Pain in left hip: Secondary | ICD-10-CM

## 2021-05-16 NOTE — Therapy (Signed)
OUTPATIENT PHYSICAL THERAPY TREATMENT NOTE   Patient Name: Oscar Mercado MRN: [id number] DOB:11-18-1980, 41 y.o., male Today's Date: 05/16/2021  PCP: Shirline Frees, NP REFERRING PROVIDER: Tarry Kos, MD   PT End of Session - 05/16/21 0820     Visit Number 2    Date for PT Re-Evaluation 07/04/21    Authorization Type [employer]    PT Start Time 0820    PT Stop Time 0900    PT Time Calculation (min) 40 min             Past Medical History:  Diagnosis Date   Carpal tunnel syndrome    bilateral   Narcolepsy and cataplexy    PTSD (post-traumatic stress disorder)    Stye    Past Surgical History:  Procedure Laterality Date   ORIF MANDIBULAR FRACTURE     Patient Active Problem List   Diagnosis Date Noted   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  He is in the process of getting scheduled with his MD.  PAIN:  Are you having pain? Yes Pain location: diffuse lateral hip pain NPRS scale:  current 3/10  Aggravating factors: standing (45 min), walking for prolonged periods Relieving factors: sitting rest Pain description: sharp and aching Severity: high Irritability: low Stage: Chronic Stability: staying the same  Patient Goals: reduce pain and avoid surgery   OBJECTIVE:  MUSCLE LENGTH: Hamstrings: Right 90 AROM deg; Left 45 AROM P!   LE MMT:   MMT Right 05/09/2021 Left 05/09/2021  Hip flexion (L2, L3) 4 3*  Knee extension (L3)      Knee flexion      Hip abduction   3-*  Hip extension   3-*  Hip external rotation 4+ 4+  Hip internal rotation 4+ 3+*  Hip adduction      Ankle dorsiflexion (L4)      Ankle plantarflexion (S1)       Ankle inversion      Ankle eversion      Great Toe ext (L5)      Grossly       (Blank rows = not tested, score listed is out of 5 possible points.  N = WNL, D = diminished, C = clear for gross weakness with myotome testing, * = concordant pain with testing)   LE ROM:   ROM Right 05/09/2021 Left 05/09/2021  Hip flexion N 90*  Hip extension      Hip abduction      Hip adduction      Hip internal rotation N N*  Hip external rotation N N  Knee flexion      Knee extension      Ankle dorsiflexion      Ankle plantarflexion      Ankle inversion      Ankle eversion        (Blank rows = not tested, N = WNL, * = concordant pain with testing)   FUNCTIONAL TESTS:  SLS: R: 30'' L 30'' with P!   Special tests: FABER, FADIR, manual hip flexion all (+) for lateral hip pain   PATIENT SURVEYS:  LEFS next visit  TODAY'S TREATMENT: [facility] Adult PT Treatment:                                                DATE: 05/16/2021 Therapeutic Exercise: Elliptical - 5 min for warm up 10'' supine bridge x10 Lateral walking GTB - 4 laps above knees RDL with slider - 3x10 Manual Therapy: Skilled palpation for TDN STM glute medius Iontophoresis:  - 1 ml dexamethasone - 4-6 hour slow release patch - placed L GT  Trigger Point Dry-Needling  Treatment instructions: Expect mild to moderate muscle soreness. S/S of pneumothorax if dry needled over a lung field, and to seek immediate medical attention should they occur. Patient verbalized understanding of these instructions and education.  Patient Consent Given: Yes Education handout provided: No Muscles treated: glute medius Electrical stimulation performed: No Parameters: N/A Treatment response/outcome: reduction in pain and muscle tension      PATIENT EDUCATION:  HEP, and outcome measures.  Pt educated via explanation, demonstration, and handout (HEP).  Pt confirms understanding verbally.    ASTERISK SIGNS     Asterisk Signs               Hip abd and ext MMT              30'' SLS              Walking tolerance                                              HOME EXERCISE PROGRAM: Access Code: [email or web address]      ASSESSMENT: CLINICAL IMPRESSION: Oscar Mercado is progressing fair with therapy.  Pt reports a mild increase in pain following therapy.  Today we concentrated on hip strengthening and pain reduction.  Will monitor response to ionto and TDN next session.  Encouraged him to contact MD for GT injection.  Pt will continue to benefit from skilled physical therapy to address remaining deficits and achieve listed goals.  Continue per POC.    OBJECTIVE IMPAIRMENTS: Pain, limited hip ROM, hip and LE muscle weakness   ACTIVITY LIMITATIONS: walking, standing, squatting, participating fully in coaching activities   PERSONAL FACTORS: See medical history and pertinent history     GOALS: SHORT TERM GOALS:   Oscar Mercado will be >75% HEP compliant to improve carryover between sessions and facilitate independent management of condition Evaluation (05/09/2021): ongoing Target date: 07/04/2021 Goal status: INITIAL    LONG TERM GOALS:   Oscar Mercado will be able to stand for 4 hours, not limited by pain  Evaluation/Baseline (05/09/2021): 30 min Target date: 07/04/2021 Goal status: INITIAL     2.  Oscar Mercado will improve the following MMTs to >/= 4/5 to show improvement in strength:  hip ext, hip abd  Evaluation/Baseline (05/09/2021): see chart in note Target date: 07/04/2021 Goal status: INITIAL     3.  Oscar Mercado will self report >/= 50% decrease in pain from evaluation  Evaluation/Baseline (05/09/2021): 10/10 max pain Target date: 07/04/2021 Goal status: INITIAL     4.  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  LEFS      PLAN: PT FREQUENCY: 1-2x/week  PT DURATION: 8 weeks (Ending 07/04/2021)    PLANNED INTERVENTIONS: Therapeutic exercises, Aquatic therapy, Therapeutic activity, Neuro Muscular re-education, Gait training, Patient/Family education, Joint mobilization, Dry Needling, Electrical stimulation, Spinal mobilization and/or manipulation, Moist heat, Taping, Vasopneumatic device, Ionotophoresis 4mg /ml Dexamethasone, and Manual therapy   PLAN FOR NEXT SESSION: progressive glute strengthening, TND   PT, DPT 05/16/21 9:06 AM Phone: [phone] Fax: [phone]

## 2021-05-18 ENCOUNTER — Encounter: Payer: Self-pay | Admitting: Orthopaedic Surgery

## 2021-05-18 ENCOUNTER — Encounter: Payer: Self-pay | Admitting: Physical Therapy

## 2021-05-19 ENCOUNTER — Other Ambulatory Visit: Payer: Self-pay

## 2021-05-19 DIAGNOSIS — M25552 Pain in left hip: Secondary | ICD-10-CM

## 2021-05-19 NOTE — Telephone Encounter (Signed)
Out of work until he sees corey

## 2021-05-19 NOTE — Telephone Encounter (Signed)
Please refer patient to Dr. Denyse Amass for ultrasound guided trochanteric bursal injection.  Thanks.

## 2021-05-20 ENCOUNTER — Ambulatory Visit: Payer: 59

## 2021-05-20 NOTE — Therapy (Incomplete)
?OUTPATIENT PHYSICAL THERAPY TREATMENT NOTE ? ? ?Patient Name: Oscar Mercado ?MRN: [id number] ?DOB:06/01/1980, 41 y.o., male ?Today's Date: 05/20/2021 ? ?PCP: Shirline FreesNafziger, Cory, NP ?REFERRING PROVIDER: Tarry KosXu, Naiping M, MD ? ? ? ? ?Past Medical History:  ?Diagnosis Date  ? Carpal tunnel syndrome   ? bilateral  ? Narcolepsy and cataplexy   ? PTSD (post-traumatic stress disorder)   ? Stye   ? ?Past Surgical History:  ?Procedure Laterality Date  ? ORIF MANDIBULAR FRACTURE    ? ?Patient Active Problem List  ? Diagnosis Date Noted  ? 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 Daytime somnolence 11/01/2018  ? ? ?REFERRING PROVIDER: Tarry KosXu, Naiping M, MD ?  ?REFERRING DIAG: L hip pain ? ?THERAPY DIAG:  ?No diagnosis found. ? ?PERTINENT HISTORY: Cataplexy, narcolepsy?, PTSD ? ?PRECAUTIONS: None ? ?SUBJECTIVE: *** ?Pt reports that he is having a little pain today.  He is in the process of getting scheduled with his MD. ? ?PAIN:  ?Are you having pain? Yes ?Pain location: diffuse lateral hip pain ?NPRS scale:  ?current ***/10  ?Aggravating factors: standing (45 min), walking for prolonged periods ?Relieving factors: sitting rest ?Pain description: sharp and aching ?Severity: high ?Irritability: low ?Stage: Chronic ?Stability: staying the same ? ?Patient Goals: reduce pain and avoid surgery ? ? ?OBJECTIVE:  ?MUSCLE LENGTH: ?Hamstrings: Right 90 AROM deg; Left 45 AROM P! ?  ?LE MMT: ?  ?MMT Right ?05/09/2021 Left ?05/09/2021 Left ?05/20/21  ?Hip flexion (L2, L3) 4 3*   ?Knee extension (L3)       ?Knee flexion       ?Hip abduction   3-* ***  ?Hip extension   3-* ***  ?Hip external rotation 4+ 4+   ?Hip internal rotation 4+ 3+*   ?Hip adduction       ?Ankle dorsiflexion (L4)       ?Ankle plantarflexion (S1)       ?Ankle inversion       ?Ankle eversion       ?Great Toe ext (L5)       ?Grossly       ? ?(Blank rows = not tested, score listed is out of 5 possible points.  N = WNL, D = diminished, C = clear for gross  weakness with myotome testing, * = concordant pain with testing) ?  ?LE ROM: ?  ?ROM Right ?05/09/2021 Left ?05/09/2021  ?Hip flexion N 90*  ?Hip extension      ?Hip abduction      ?Hip adduction      ?Hip internal rotation N N*  ?Hip external rotation N N  ?Knee flexion      ?Knee extension      ?Ankle dorsiflexion      ?Ankle plantarflexion      ?Ankle inversion      ?Ankle eversion      ?  ?(Blank rows = not tested, N = WNL, * = concordant pain with testing) ?  ?FUNCTIONAL TESTS:  ?SLS: R: 30'' L 30'' with P! ?  ?Special tests: FABER, FADIR, manual hip flexion all (+) for lateral hip pain ?  ?PATIENT SURVEYS:  ?LEFS next visit ?05/20/2021: ?  ?  ? ?TODAY'S TREATMENT: ?[facility] Adult PT Treatment:  DATE: 05/20/2021 ?Therapeutic Exercise: ?Elliptical x 5 min while gathering subjective info ?Hip abd and ext MMT *** ?LEFS ?Supine bridge 5" hold 2x10 ?Cybex hip flexion ?Cybex hip extension ?Cybex hip abduction ?SLR with QS Lt ?SLR with IR Lt ?Lateral walking GTB x 4 laps (band above knees) ?Monster walks GTB x 4 laps (band around ankles) ?Manual Therapy: ?*** ?Neuromuscular re-ed: ?SLS ?Therapeutic Activity: ?*** ?Modalities: ?*** ?Self Care: ?*** ? ? ?[facility] Adult PT Treatment:                                                DATE: 05/16/2021 ?Therapeutic Exercise: ?Elliptical - 5 min for warm up ?10'' supine bridge x10 ?Lateral walking GTB - 4 laps above knees ?RDL with slider - 3x10 ?Manual Therapy: ?Skilled palpation for TDN ?STM glute medius ?Iontophoresis:  ?- 1 ml dexamethasone ?- 4-6 hour slow release patch ?- placed L GT ? ?Trigger Point Dry-Needling  ?Treatment instructions: Expect mild to moderate muscle soreness. S/S of pneumothorax if dry needled over a lung field, and to seek immediate medical attention should they occur. Patient verbalized understanding of these instructions and education. ? ?Patient Consent Given: Yes ?Education handout provided: No ?Muscles treated:  glute medius ?Electrical stimulation performed: No ?Parameters: N/A ?Treatment response/outcome: reduction in pain and muscle tension ? ? ?   ?PATIENT EDUCATION:  ?HEP, and outcome measures.  Pt educated via explanation, demonstration, and handout (HEP).  Pt confirms understanding verbally.  ?  ?ASTERISK SIGNS ?  ?  ?Asterisk Signs  3/15            ?Hip abd and ext MMT ***             ?30'' SLS ***             ?Walking tolerance              ?               ?               ?  ?HOME EXERCISE PROGRAM: ?Access Code: [email or web address] ?  ?   ?ASSESSMENT: ?CLINICAL IMPRESSION: ?*** ? ?Oscar Mercado is progressing fair with therapy.  Pt reports a mild increase in pain following therapy.  Today we concentrated on hip strengthening and pain reduction.  Will monitor response to ionto and TDN next session.  Encouraged him to contact MD for GT injection.  Pt will continue to benefit from skilled physical therapy to address remaining deficits and achieve listed goals.  Continue per POC. ? ?  ?OBJECTIVE IMPAIRMENTS: Pain, limited hip ROM, hip and LE muscle weakness ?  ?ACTIVITY LIMITATIONS: walking, standing, squatting, participating fully in coaching activities ?  ?PERSONAL FACTORS: See medical history and pertinent history ?  ?  ?GOALS: ?SHORT TERM GOALS: ?  ?Oscar Mercado will be >75% HEP compliant to improve carryover between sessions and facilitate independent management of condition ?Evaluation (05/09/2021): ongoing ?Target date: 07/04/2021 ?Goal status: INITIAL ?   ?LONG TERM GOALS: ?  ?Oscar Mercado will be able to stand for 4 hours, not limited by pain  ?Evaluation/Baseline (05/09/2021): 30 min ?Target date: 07/04/2021 ?Goal status: INITIAL ?  ?  ?2.  Oscar Mercado will improve the following MMTs to >/= 4/5 to show improvement in strength:  hip ext, hip abd  ?Evaluation/Baseline (05/09/2021): see chart in note ?Target date: 07/04/2021 ?Goal  status: INITIAL ?  ?  ?3.  Oscar Mercado will self report >/= 50% decrease in pain from evaluation   ?Evaluation/Baseline (05/09/2021): 10/10 max pain ?Target date: 07/04/2021 ?Goal status: INITIAL ?  ?  ?4.  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  ?  ?5.  LEFS ? 05/20/2021:  ?   ?PLAN: ?PT FREQUENCY: 1-2x/week ?  ?PT DURATION: 8 weeks (Ending 07/04/2021) ?  ?PLANNED INTERVENTIONS: Therapeutic exercises, Aquatic therapy, Therapeutic activity, Neuro Muscular re-education, Gait training, Patient/Family education, Joint mobilization, Dry Needling, Electrical stimulation, Spinal mobilization and/or manipulation, Moist heat, Taping, Vasopneumatic device, Ionotophoresis 4mg /ml Dexamethasone, and Manual therapy ?  ?PLAN FOR NEXT SESSION: progressive glute strengthening, TND ? ? ? , PTA ?05/20/21 10:06 AM ? ?  ? ?

## 2021-05-23 ENCOUNTER — Ambulatory Visit: Payer: 59 | Admitting: Physical Therapy

## 2021-05-25 ENCOUNTER — Ambulatory Visit (INDEPENDENT_AMBULATORY_CARE_PROVIDER_SITE_OTHER): Payer: 59 | Admitting: Pulmonary Disease

## 2021-05-25 ENCOUNTER — Other Ambulatory Visit: Payer: Self-pay

## 2021-05-25 ENCOUNTER — Encounter: Payer: Self-pay | Admitting: Pulmonary Disease

## 2021-05-25 ENCOUNTER — Ambulatory Visit: Payer: 59 | Admitting: Family Medicine

## 2021-05-25 VITALS — BP 160/80 | HR 88 | Ht 66.0 in | Wt 153.2 lb

## 2021-05-25 DIAGNOSIS — G47411 Narcolepsy with cataplexy: Secondary | ICD-10-CM | POA: Diagnosis not present

## 2021-05-25 DIAGNOSIS — G4726 Circadian rhythm sleep disorder, shift work type: Secondary | ICD-10-CM | POA: Diagnosis not present

## 2021-05-25 DIAGNOSIS — G4759 Other parasomnia: Secondary | ICD-10-CM | POA: Diagnosis not present

## 2021-05-25 DIAGNOSIS — G4752 REM sleep behavior disorder: Secondary | ICD-10-CM | POA: Diagnosis not present

## 2021-05-25 MED ORDER — SUNOSI 75 MG PO TABS: 1.0000 | ORAL_TABLET | Freq: Every day | ORAL | 5 refills | Status: DC

## 2021-05-25 MED ORDER — CLONAZEPAM 0.5 MG PO TABS: 0.5000 mg | ORAL_TABLET | Freq: Every day | ORAL | 5 refills | Status: DC

## 2021-05-25 NOTE — Progress Notes (Signed)
? ?[facility] ? ?Chief Complaint  ?Patient presents with  ? Follow-up  ?  narcolepsy  ? ? ?Past Surgical History:  ?He  has a past surgical history that includes ORIF mandibular fracture. ? 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 ?Constitutional:  ?BP (!) 160/80 (BP Location: Left Arm, Cuff Size: Normal) Comment: conversation increased bp  Pulse 88   Ht 5\' 6"  (1.676 m)   Wt 153 lb 3.2 oz (69.5 kg)   SpO2 98%   BMI 24.73 kg/m?  ? ?Brief Summary:  ?Oscar Mercado is a 41 y.o. 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 ?  ? ? ? ?Subjective:  ? ?I last saw him in October 2020.  He wanted to try more natural therapies for narcolepsy.  He tried different remedies to improve his sleep quality, but these weren't successful. ? ?He has no trouble falling asleep.  His sleep is disrupted.  He is tired and sleepy during the day.  He works on an November 2020 and fell asleep at work.  He has been acting out his dreams - this includes punching, grabbing, kicking, and screaming.  He is worried he will hurt someone when he is asleep. ? ?He continues to have shift work, and has rotating shift schedule every 2 weeks. ? ?Physical Exam:  ? ?Appearance - well kempt  ? ?ENMT - no sinus tenderness, no oral exudate, no LAN, Mallampati 3 airway, no stridor ? ?Respiratory - equal breath sounds bilaterally, no wheezing or rales ? ?CV - s1s2 regular rate and rhythm, no murmurs ? ?Ext - no clubbing, no edema ? ?Skin - no rashes ? ?Psych - normal mood and affect ?  ?Sleep Tests:  ?PSG 11/06/18 >> SO 0.4 minutes, total sleep time 394.5 minutes, frequent REM with fragmented sleep, AHI 0.6, SpO2 low 89%, PLMI 0 ?MSLT 11/08/18 >> 5/5 naps with sleep, mean sleep latency 1 second, 4/5 naps with SOREM ? ?Social History:  ?He  reports that he has been smoking cigars. He has never used smokeless tobacco. He reports current alcohol use. He reports current drug use. Drug:  Marijuana. ? ?Family History:  ?His family history includes Diabetes in his maternal grandmother. ?  ? ? ?Assessment/Plan:  ? ?Narcolepsy with cataplexy. ?- he is willing to try more traditional therapies for this at this time ?- he had trouble tolerating armodafinil due to palpitations ?- discussed importance of regular sleep-wake schedule, and scheduled brief naps during the day ?- reviewed proper sleep hygiene ?- will have him try  ?- will have him start sunosi 75 mg daily, pending insurance approval ? ?REM/Non REM parasomnias. ?- will add klonopin 0.5 mg qhs ? ?Shift work with rotating schedule. 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 ?Time Spent Involved in Patient Care on Day of Examination:  ?37 minutes ? ?Follow up:  ? ?Patient Instructions  ?Will send your prescription for narcolepsy treatment later today ? ?Check with your human resource department about what arrangements can be made for you ? ?Follow up in 4 to 6 weeks ? ?Medication List:  ? ?Allergies as of 05/25/2021   ?No Known Allergies ?  ? ?  ?Medication List  ?  ? ?  ? Accurate as of May 25, 2021  5:09 PM. If you have any questions, ask your nurse or doctor.  ?  ?  ? ?  ? ?  clonazePAM 0.5 MG tablet ?Commonly known as: KlonoPIN ?Take 1 tablet (0.5 mg total) by mouth at bedtime. ?Started by: Coralyn Helling, MD ?  ?meloxicam 15 MG tablet ?Commonly known as: MOBIC ?Take 1 tablet (15 mg total) by mouth daily. ?  ?methocarbamol 500 MG tablet ?Commonly known as: Robaxin ?Take 1 tablet (500 mg total) by mouth 2 (two) times daily as needed. ?  ?Sunosi 75 MG Tabs ?Generic drug: Solriamfetol HCl ?Take 1 tablet by mouth daily. ?Started by: Coralyn Helling, MD ?  ? ?  ? ? ?Signature:  ?Coralyn Helling, MD ?[facility] ?Pager - [phone] ?05/25/2021, 5:09 PM ?  ? ? ? ? ? ? ? ? ?

## 2021-05-25 NOTE — Patient Instructions (Signed)
Will send your prescription for narcolepsy treatment later today ? ?Check with your human resource department about what arrangements can be made for you ? ?Follow up in 4 to 6 weeks ?

## 2021-05-27 ENCOUNTER — Ambulatory Visit: Payer: 59 | Admitting: Physical Therapy

## 2021-05-29 NOTE — Progress Notes (Signed)
? ? ?  Subjective:   ? ?CC: L hip pain ? ?I, Christoper Fabian, LAT, ATC, am serving as scribe for Dr. Clementeen Graham. ? ?HPI: Pt is a 41 y/o male presenting w/ c/o L hip pain x approximately one year.  He locates his pain to his L lateral hip.  He is currently being treated by Dr. Roda Shutters and has had a prior hip joint injection w/ Dr. Alvester Morin on 04/06/21. ? ?Radiating pain: yes into his L groin ?Low back pain: yes (bulging disc at L4-5 treated w/ prior ESI) ?Mechanical hip symptoms: yes ?Aggravating factors: jogging; getting into/out of car; prolonged standing; laying on his L side ?Treatments tried: hip joint injection w/ Dr. Alvester Morin on 04/06/21; current PT; OTC anti-inflammatories; meloxicam;  ? ?Diagnostic testing: L hip XR- 03/12/21; L hip MRI- 05/24/20; L-spine MRI- 05/24/20; L-spine XR- 05/06/20 and 07/21/18 ? ?Pertinent review of Systems: No fevers or chills ? 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 ? ?Objective:   ? ?Vitals:  ? 06/01/21 0931  ?BP: 110/74  ?Pulse: 80  ?SpO2: 95%  ? ?General: Well Developed, well nourished, and in no acute distress.  ? ?MSK: Left hip normal-appearing ?Tender palpation greater trochanter. ?Hip abduction strength is intact.  External rotation strength is diminished 4/5 with pain. ? ?Lab and Radiology Results ? ?Procedure: Real-time Ultrasound Guided Injection of left hip greater trochanter bursa and tendon insertion ?Device: Philips Affiniti 50G ?Images permanently stored and available for review in PACS ?Verbal informed consent obtained.  Discussed risks and benefits of procedure. Warned about infection bleeding damage to structures skin hypopigmentation and fat atrophy among others. ?Patient expresses understanding and agreement ?Time-out conducted.   ?Noted no overlying erythema, induration, or other signs of local infection.   ?Skin prepped in a sterile fashion.   ?Local anesthesia: Topical Ethyl chloride.   ?With sterile technique and under real time  ultrasound guidance: 40 mg of Kenalog and 2 mL of Marcaine injected into greater trochanter tendon insertion/bursa area of maximum tenderness. Fluid seen entering the bursa.   ?Completed without difficulty   ?Pain immediately resolved suggesting accurate placement of the medication.   ?Advised to call if fevers/chills, erythema, induration, drainage, or persistent bleeding.   ?Images permanently stored and available for review in the ultrasound unit.  ?Impression: Technically successful ultrasound guided injection. ? ? ? ? ? ? ? ?Impression and Recommendations:   ? ?Assessment and Plan: ?41 y.o. male with persistent left hip pain.  He is currently receiving physical therapy which I think should be the right treatment.  Plan for greater trochanter injection today which may help.  Recheck back with his orthopedic team or myself as needed.  Continue physical therapy.. ? ?PDMP not reviewed this encounter. ?Orders Placed This Encounter  ?Procedures  ? [place] LIMITED JOINT SPACE STRUCTURES LOW LEFT(NO LINKED CHARGES)  ?  Order Specific Question:   Reason for Exam (SYMPTOM  OR DIAGNOSIS REQUIRED)  ?  Answer:   L hip pain  ?  Order Specific Question:   Preferred imaging location?  ?  Answer:   [facility]  ? ?No orders of the defined types were placed in this encounter. ? ? ?Discussed warning signs or symptoms. Please see discharge instructions. Patient expresses understanding. ? ? ?The above documentation has been reviewed and is accurate and complete Clementeen Graham, M.D. ? ?

## 2021-05-30 ENCOUNTER — Telehealth: Payer: Self-pay | Admitting: Physical Therapy

## 2021-05-30 ENCOUNTER — Ambulatory Visit: Payer: 59 | Admitting: Physical Therapy

## 2021-05-30 NOTE — Telephone Encounter (Signed)
Called and informed patient of missed visit and provided reminder of next appt and attendance policy.  Canceled all but 1 appt, will schedule 1 at a time moving forward. ? ?

## 2021-06-01 ENCOUNTER — Ambulatory Visit (INDEPENDENT_AMBULATORY_CARE_PROVIDER_SITE_OTHER): Payer: 59 | Admitting: Family Medicine

## 2021-06-01 ENCOUNTER — Other Ambulatory Visit: Payer: Self-pay

## 2021-06-01 ENCOUNTER — Encounter: Payer: Self-pay | Admitting: Family Medicine

## 2021-06-01 ENCOUNTER — Ambulatory Visit: Payer: Self-pay

## 2021-06-01 VITALS — BP 110/74 | HR 80 | Ht 66.0 in | Wt 151.0 lb

## 2021-06-01 DIAGNOSIS — M25552 Pain in left hip: Secondary | ICD-10-CM | POA: Diagnosis not present

## 2021-06-01 NOTE — Patient Instructions (Addendum)
Nice to meet you today. ? ?You had a L hip (GT) injection.  Call or go to the ER if you develop a large red swollen joint with extreme pain or oozing puss.  ? ?Con't PT as prescribed by Dr. Roda Shutters. ? ?Follow-up: as needed ?

## 2021-06-03 ENCOUNTER — Ambulatory Visit: Payer: 59 | Admitting: Physical Therapy

## 2021-06-04 ENCOUNTER — Other Ambulatory Visit (HOSPITAL_COMMUNITY): Payer: Self-pay

## 2021-06-04 ENCOUNTER — Telehealth: Payer: Self-pay | Admitting: Pharmacy Technician

## 2021-06-04 NOTE — Telephone Encounter (Signed)
Patient Advocate Encounter ? ?Received notification from [employer] that prior authorization for SUNOSI 75MG  is required. ?  ?PA submitted on 3.30.23 ?Key [id number] ?Status is pending ?  ?[facility] will continue to follow ? ?Ranier Coach R Yanitza Shvartsman, CPhT ?Patient Advocate ?Phone: [phone] ?Fax:  [phone] ? ?

## 2021-06-06 ENCOUNTER — Ambulatory Visit: Payer: 59 | Admitting: Physical Therapy

## 2021-06-08 NOTE — Telephone Encounter (Signed)
Patient Advocate Encounter ? ?Received notification from [employer] that the request for prior authorization for [facility] 75mg  tabs has been denied because the patient has not tried or cannot use both of the following: ? ?(A) One of the following: ?(I) Amphetamine based stimulant (for example: amphetamine, dextroamphetamine). (II) Methylphenidate based stimulant. ? ?(B) One of the following: ?(I) Modafinil (Provigil). ?(II) Armodafinil (Nuvigil) ?   ?  ?Specialty Pharmacy Patient Advocate ?Fax: [phone]  ?

## 2021-06-10 NOTE — Telephone Encounter (Signed)
Can you start an appeal.   ? ?[name], can you please also let the patient know that his insurance denies coverage for sunosi unless he tries other approved medications first, and that we are trying to appeal this decision. ?

## 2021-06-11 ENCOUNTER — Other Ambulatory Visit (HOSPITAL_COMMUNITY): Payer: Self-pay

## 2021-06-11 NOTE — Telephone Encounter (Signed)
Patient Advocate Encounter ? ?Received notification from [place] that the request for prior authorization for Sunosi has been denied again because the patient hasn't tried any Amphetamine based stimulant (for example: amphetamine, dextroamphetamine) or any Methylphenidate based stimulant. ? ? ?Specialty Pharmacy Patient Advocate ?Fax: [phone]  ?

## 2021-06-15 NOTE — Telephone Encounter (Signed)
ATC patient. VM full. Will attempt to call back.  ?Dr. Craige Cotta, appeal was denied again please advise. Thanks!  ?

## 2021-06-18 NOTE — Telephone Encounter (Signed)
Pt returning call

## 2021-06-18 NOTE — Telephone Encounter (Signed)
ATC patient. Left detailed message (ok per dpr) letting patient know sunosi was denied and to call back and let us know if hes interested in an amphetamine based medication.  ?

## 2021-06-18 NOTE — Telephone Encounter (Signed)
Called and spoke to patient. He is okay with trying a different medication. Would like medication sent to [facility] on [address] in [place].  ?

## 2021-06-18 NOTE — Telephone Encounter (Signed)
Please let him know his insurance denied coverage of sunosi unless he tries insurance approved medication first.  Please let him know his insurance is requiring he try an amphetamine based medication first.  If he is agreeable to this, then I can send in a prescription.   ?

## 2021-06-22 MED ORDER — AMPHETAMINE-DEXTROAMPHETAMINE 5 MG PO TABS: 5.0000 mg | ORAL_TABLET | Freq: Two times a day (BID) | ORAL | 0 refills | Status: DC

## 2021-06-22 NOTE — Telephone Encounter (Signed)
I have sent a script for adderall 5 mg daily in the morning and 5 mg daily in the afternoon. ?

## 2021-06-22 NOTE — Telephone Encounter (Signed)
ATC patient. Left detailed message on cell phone vm (ok per dpr) letting patient know adderall was sent in. Left instructions on how to take medicine per Dr. Craige Cotta on vm. Advised patient to call back fro questions. Nothing further needed. ?

## 2021-06-23 ENCOUNTER — Encounter: Payer: Self-pay | Admitting: Orthopaedic Surgery

## 2021-06-23 ENCOUNTER — Encounter: Payer: Self-pay | Admitting: Adult Health

## 2021-06-23 NOTE — Telephone Encounter (Signed)
That's all fine

## 2021-06-24 ENCOUNTER — Encounter: Payer: Self-pay | Admitting: Pulmonary Disease

## 2021-06-24 ENCOUNTER — Encounter: Payer: Self-pay | Admitting: Adult Health

## 2021-06-24 NOTE — Telephone Encounter (Signed)
Routing to [place] so she can see the message from pt about where the letter needs to be faxed. ?

## 2021-06-24 NOTE — Telephone Encounter (Signed)
Please advise 

## 2021-06-24 NOTE — Telephone Encounter (Signed)
Please let him know that I have written and signed a letter for him.  It is located in C pod this morning.  Please ask him if he would like to pick the letter up, or have it mailed/faxed.  Please let him know that I have written for him to be out of work through Jul 06, 2021 and we can assess whether this needs to be extended after he has his follow up appointment. ?

## 2021-06-24 NOTE — Telephone Encounter (Signed)
Dr. Sood, please see mychart message sent by pt and advise. °

## 2021-06-24 NOTE — Telephone Encounter (Signed)
Letter was faxed to requested fax number. Successful confirmation received. A copy of letter was placed in Dr. Evlyn Courier mail cabinet for safe keeping. Nothing further needed at this time.  ?

## 2021-06-25 NOTE — Telephone Encounter (Signed)
Called patient explained to him if he needs forms adjusted he will need for them to fax [place] forms. He seemed confused. [place] forms prev filled out by pcp but not fully completed. ? ?

## 2021-07-06 ENCOUNTER — Ambulatory Visit (INDEPENDENT_AMBULATORY_CARE_PROVIDER_SITE_OTHER): Payer: 59 | Admitting: Pulmonary Disease

## 2021-07-06 ENCOUNTER — Encounter: Payer: Self-pay | Admitting: Pulmonary Disease

## 2021-07-06 VITALS — BP 122/68 | HR 68 | Temp 98.3°F | Ht 66.0 in | Wt 143.6 lb

## 2021-07-06 DIAGNOSIS — G4752 REM sleep behavior disorder: Secondary | ICD-10-CM

## 2021-07-06 DIAGNOSIS — G47411 Narcolepsy with cataplexy: Secondary | ICD-10-CM

## 2021-07-06 DIAGNOSIS — Z0279 Encounter for issue of other medical certificate: Secondary | ICD-10-CM

## 2021-07-06 NOTE — Patient Instructions (Signed)
Send an update on how you are doing after you have been on Adderall for 2 weeks ? ?Continue klonopin 0.5 mg pill nightly ? ?Send your disability paperwork by email through the MyChart system ? ?Follow up in 4 months ?

## 2021-07-06 NOTE — Progress Notes (Signed)
? ?[facility] ? ?Chief Complaint  ?Patient presents with  ? Follow-up  ?  Doing ok  ? ? ?Past Surgical History:  ?He  has a past surgical history that includes ORIF mandibular fracture. ? 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 ?Constitutional:  ?BP 122/68 (BP Location: Left Arm, Cuff Size: Normal)   Pulse 68   Temp 98.3 ?F (36.8 ?C) (Temporal)   Ht 5\' 6"  (1.676 m)   Wt 143 lb 9.6 oz (65.1 kg)   SpO2 98%   BMI 23.18 kg/m?  ? ?Brief Summary:  ?Oscar Mercado is a 41 y.o. 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 ?  ? ? ? ?Subjective:  ? ?He hasn't been able to get adderall yet due to financial constraints.  He should be able to get this soon.  Sunosi wasn't approved by insurance unless he tried approved medications first. ? ?He is trying to apply for short term disability. ? ?Physical Exam:  ? ?Appearance - well kempt  ? ?ENMT - no sinus tenderness, no oral exudate, no LAN, Mallampati 3 airway, no stridor ? ?Respiratory - equal breath sounds bilaterally, no wheezing or rales ? ?CV - s1s2 regular rate and rhythm, no murmurs ? ?Ext - no clubbing, no edema ? ?Skin - no rashes ? ?Psych - normal mood and affect ?  ?Sleep Tests:  ?PSG 11/06/18 >> SO 0.4 minutes, total sleep time 394.5 minutes, frequent REM with fragmented sleep, AHI 0.6, SpO2 low 89%, PLMI 0 ?MSLT 11/08/18 >> 5/5 naps with sleep, mean sleep latency 1 second, 4/5 naps with SOREM ? ?Social History:  ?He  reports that he has quit smoking. His smoking use included cigars. He has never used smokeless tobacco. He reports current alcohol use. He reports current drug use. Drug: Marijuana. ? ?Family History:  ?His family history includes Diabetes in his maternal grandmother. ?  ? ? ?Assessment/Plan:  ? ?Narcolepsy with cataplexy. ?- he is willing to try more traditional therapies for this at this time ?- he had trouble tolerating armodafinil due to palpitations ?- discussed importance of  regular sleep-wake schedule, and scheduled brief naps during the day ?- reviewed proper sleep hygiene ?- he will start adderall 5 mg bid; can titrate up as needed ?- if cataplexy persists, then can add medication for this ?- he will email disability forms to be completed ? ?REM/Non REM parasomnias. ?- continue klonopin 0.5 mg qhs ? ?Time Spent Involved in Patient Care on Day of Examination:  ?15 minutes ? ?Follow up:  ? ?Patient Instructions  ?Send an update on how you are doing after you have been on Adderall for 2 weeks ? ?Continue klonopin 0.5 mg pill nightly ? ?Send your disability paperwork by email through the MyChart system ? ?Follow up in 4 months ? ?Medication List:  ? ?Allergies as of 07/06/2021   ?No Known Allergies ?  ? ?  ?Medication List  ?  ? ?  ? Accurate as of Jul 06, 2021 10:52 AM. If you have any questions, ask your nurse or doctor.  ?  ?  ? ?  ? ?amphetamine-dextroamphetamine 5 MG tablet ?Commonly known as: Adderall ?Take 1 tablet (5 mg total) by mouth 2 (two) times daily with a meal. ?  ?clonazePAM 0.5 MG tablet ?Commonly known as: KlonoPIN ?Take 1 tablet (0.5 mg total) by mouth at bedtime. ?  ?methocarbamol 500 MG tablet ?Commonly known as: Robaxin ?Take 1 tablet (500 mg total) by  mouth 2 (two) times daily as needed. ?  ? ?  ? ? ?Signature:  ?Coralyn Helling, MD ?[facility] ?Pager - [phone] ?07/06/2021, 10:52 AM ?  ? ? ? ? ? ? ? ? ?

## 2021-07-07 ENCOUNTER — Encounter: Payer: Self-pay | Admitting: Pulmonary Disease

## 2021-07-10 ENCOUNTER — Encounter: Payer: Self-pay | Admitting: Pulmonary Disease

## 2021-07-10 ENCOUNTER — Telehealth: Payer: Self-pay | Admitting: Adult Health

## 2021-07-10 NOTE — Telephone Encounter (Signed)
Patient need Dr. Roda Shutters to set up a telephone meeting with his other doctor Dr Caffie Pinto  (without patient) to discuss his disability claim.** provided fax number so Dr. Roda Shutters can send his availability his assistant will be sending this form over. Precious's number is [phone] ? ?

## 2021-07-10 NOTE — Telephone Encounter (Signed)
Requesting a P2P and faxing a request for availability.  ?

## 2021-07-13 ENCOUNTER — Telehealth: Payer: Self-pay | Admitting: Pulmonary Disease

## 2021-07-13 NOTE — Telephone Encounter (Signed)
Don't know anything about this.  Sorry.

## 2021-07-13 NOTE — Telephone Encounter (Signed)
I received forms from Mr. Withrow regarding his application for short term disability and I am in the process of completing these.  I needed few items clarified and sent him an email response on 07/10/21.  Unsure if he has received this message.  Can you please contact him and ask for him to please provide answers to the following: ?  ?1) What date do you want the disability to start from? ?  ?2) Do you feel like you are able to return to work, and if so on what date?  If not, then how long would you want to remain out of work for? ?  ?3) If you aren't able to return to full work duties, then are you able to return to restricted work duties?  If so, then what date would the restricted work duty start and when do you anticipate it would end? ?

## 2021-07-13 NOTE — Telephone Encounter (Signed)
Pt returning call regarding disability forms.  [phone].   ?

## 2021-07-13 NOTE — Telephone Encounter (Signed)
ATC patient.  LMTCB. 

## 2021-07-13 NOTE — Telephone Encounter (Signed)
1) What date do you want the disability to start from? ?Starting on 06/25/2021 ?  ?2) Do you feel like you are able to return to work, and if so on what date? He feels he is able to return to work once he starts adderall 5 mg combined with klonopin.  States he has not been able to pick up adderall yet but is planning to pick up today.  ?Would like to return on 07/27/2021 if both medications work well for him.  ? If not, then how long would you want to remain out of work for? ?  ?3) If you aren't able to return to full work duties, then are you able to return to restricted work duties? Patient would like to return on restricted work duties. Would like to try to get off swing shifts and to be on a set schedule, a few more breaks for rest throughout the shift. Accommodations.  ? If so, then what date would the restricted work duty start and when do you anticipate it would end? Restricted duties starting when he goes back (07/27/21) lasting for about 2-4 weeks based off how he does on his new medication.  ? ? ?To add: Patient may need new Rx sent in for adderall. He states he has not been able to pick up medication yet due to financial hardships but is planning to get it today. He will call if adderall needs to be sent again  ?

## 2021-07-14 NOTE — Telephone Encounter (Signed)
Received PPW. Printed and placed on providers desk for review.  ?

## 2021-07-15 NOTE — Telephone Encounter (Signed)
Form completed and given to [facility] in [employer]. ?

## 2021-07-16 ENCOUNTER — Telehealth: Payer: Self-pay | Admitting: Pulmonary Disease

## 2021-07-16 NOTE — Telephone Encounter (Signed)
Signed disability form faxed to [facility] - fax# [phone].  Included a copy of patient's meds list.  ?

## 2021-07-27 NOTE — Telephone Encounter (Signed)
Dr. Halford Chessman, please see most recent mychart message sent by pt and advise.

## 2021-08-11 MED ORDER — CLONAZEPAM 1 MG PO TABS
1.0000 mg | ORAL_TABLET | Freq: Two times a day (BID) | ORAL | 3 refills | Status: DC
Start: 2021-08-11 — End: 2022-01-25

## 2021-08-11 MED ORDER — AMPHETAMINE-DEXTROAMPHETAMINE 10 MG PO TABS: 10.0000 mg | ORAL_TABLET | Freq: Two times a day (BID) | ORAL | 0 refills | Status: DC

## 2021-08-11 MED ORDER — FLUOXETINE HCL 20 MG PO CAPS: 20.0000 mg | ORAL_CAPSULE | Freq: Every day | ORAL | 3 refills | Status: DC

## 2021-08-11 NOTE — Telephone Encounter (Signed)
Please let him know that I have sent new scripts for increased dose of adderall and klonopin, and new script for fluoxetine.

## 2021-08-31 ENCOUNTER — Telehealth: Payer: Self-pay | Admitting: Pulmonary Disease

## 2021-09-01 ENCOUNTER — Encounter: Payer: Self-pay | Admitting: Pulmonary Disease

## 2021-09-09 NOTE — Telephone Encounter (Signed)
The Disability Provider Statement form that Mr. Nuzum hand carried to the office last week is an exact duplicate of the form Dr. Delton Coombes signed and was faxed to [facility] on 07/16/21.  In addition, on 6/26 office notes for March and May 2023 were faxed to [facility], per their request.  I have called [place] and spoken to [facility].  She could see that the faxes were received and she will check with an examiner to find out if Dr. Delton Coombes needs to fill the form out again.

## 2021-09-09 NOTE — Telephone Encounter (Signed)
The Disability Provider Statement form that Mr. Gardella provided is an exact duplicate of the form Dr. Delton Coombes signed and was faxed to [facility] on 07/16/21.  In addition, on 6/26 office notes for March and May 2023 were faxed to [facility], per their request.  I have called Loletta Parish and spoken to [facility].  She could see that the faxes were received and she will check with an examiner to find out if Dr. Delton Coombes needs to fill the form out again.

## 2021-09-11 NOTE — Telephone Encounter (Signed)
Drinda Butts from [employer] called back to say they do not need the disability form filled out at this time.  However, they will be requesting additional information or send another form at the end of July.

## 2021-09-29 NOTE — Telephone Encounter (Signed)
Received the following message from patient:   "This message is for Dr. Craige Cotta and staff to provide an accurate update of how I am feeling currently with me taking the updated dosages and introducing the new medicine.    As far as the updated dosages, it seems I feel a very slight improvement with the Excessive Daytime Sleepiness, and what I mean when I say slightly is almost no change at all. Als,  it's still extremely difficult to stay asleep. Usually I can't stay asleep longer than more than a hour and a half and haven't had a night of more than 2 hours consistently since I started taking the new dosages.    Last week(Thursday, July 20th) I introduced the new medicine to my 'routine'. The first night of taking it, I experienced heart palpitations and was the first time that I struggled to go to sleep. Finally after many hours(6-7 hours)later that morning I was able to fall asleep. During that period I was only able to fall asleep for a brief 30 minutes approximately. I was concerned about my body's reaction to the new medicine so I thought I should hold off continuing to take it until I had recommendations of how you and I should proceed further.    Thank you in advance for all your help."  Dr. Craige Cotta, can you please advise? Thanks!

## 2021-10-09 NOTE — Telephone Encounter (Signed)
Dr. Craige Cotta, please see recent mychart messages sent by pt.

## 2021-10-12 ENCOUNTER — Other Ambulatory Visit: Payer: Self-pay | Admitting: Pulmonary Disease

## 2021-10-12 MED ORDER — VENLAFAXINE HCL ER 75 MG PO CP24: 75.0000 mg | ORAL_CAPSULE | Freq: Every day | ORAL | 5 refills | Status: DC

## 2021-10-12 NOTE — Telephone Encounter (Signed)
Dr. Craige Cotta, please see recent message from pt about the fluoxetine and advise.

## 2021-10-13 NOTE — Telephone Encounter (Signed)
Dr. Craige Cotta, please advise on pt's question below. Pt states there are times he doesn't take his medication due to work. Thanks.    "Ok. Also I just wanted to know if you could give me any recommendations for days that are not feasible to take my medication. For clarity, I mean important days where I really need to be awake, what can I do on those days, if anything. Thank you in advance for your assistance"

## 2021-11-13 ENCOUNTER — Ambulatory Visit: Payer: 59 | Admitting: Adult Health

## 2021-11-20 ENCOUNTER — Encounter: Payer: Self-pay | Admitting: Adult Health

## 2021-11-20 ENCOUNTER — Ambulatory Visit (INDEPENDENT_AMBULATORY_CARE_PROVIDER_SITE_OTHER): Payer: 59 | Admitting: Adult Health

## 2021-11-20 VITALS — BP 120/80 | HR 71 | Ht 66.0 in | Wt 153.0 lb

## 2021-11-20 DIAGNOSIS — G47411 Narcolepsy with cataplexy: Secondary | ICD-10-CM

## 2021-11-20 DIAGNOSIS — F439 Reaction to severe stress, unspecified: Secondary | ICD-10-CM | POA: Diagnosis not present
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
## 2021-11-20 NOTE — Patient Instructions (Addendum)
Try Adderall 10mg  in am 7 am and 1 pm.  Decrease Klonopin 1mg  At bedtime   Continue on Effexor XR 75mg  daily  Refer to psychologist for counseling -stress management.  Follow up with Dr.  in 4 weeks and As needed

## 2021-11-23 NOTE — Progress Notes (Signed)
Reviewed and agree with assessment/plan.   Sitara Cashwell, MD [facility] 11/23/2021, 7:30 PM Pager:  [phone]  

## 2021-11-23 NOTE — Assessment & Plan Note (Signed)
Narcolepsy with cataplexy with severe symptom burden-patient has significant daytime sleepiness difficulty with being able to perform daily activities work etc. We will decrease Klonopin to bedtime only to see if this will decrease in his daytime sleepiness symptoms.  Long discussion with patient regarding giving Effexor some time to work. Change Adderall dosing to twice daily but second dose before 2 PM Refer to counseling for chronic stress Close follow-up  Plan  Patient Instructions  Try Adderall 10mg  in am 7 am and 1 pm.  Decrease Klonopin 1mg  At bedtime   Continue on Effexor XR 75mg  daily  Refer to psychologist for counseling -stress management.  Follow up with Dr. Halford Chessman  in 4 weeks and As needed

## 2021-11-30 ENCOUNTER — Telehealth: Payer: Self-pay | Admitting: Pulmonary Disease

## 2021-11-30 NOTE — Telephone Encounter (Signed)
We've received a disability continuation form from [employer] - I have filled out as much as I am able based on the form completed in May 2023 and the chart notes form his 11/20/21 appointment with Rexene Edison, NP. I called and left voice message for patient.  Need to know:  1) is he currently working and if so when did he return to work?  2) is he working a restricted shift or working with restricted duties?  3) what duration needs to be put on the form for disability expiration.  The form filled out in May 2023 had a 08/27/2021 expiration date.  I've asked him to call me back and said it would be fine for him to leave me a voice message or a time it is convenient for me to call him.
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
## 2021-12-03 NOTE — Telephone Encounter (Signed)
Disability form was completed by Dr. Halford Chessman - faxed to [facility] fax# [phone].  Called patient to let him know and mailed him a hard copy.

## 2021-12-08 NOTE — Telephone Encounter (Signed)
Office visit notes from 11/20/21 appointment with Rexene Edison, NP faxed to [facility] - fax# [phone]

## 2021-12-14 ENCOUNTER — Telehealth: Payer: Self-pay | Admitting: Pulmonary Disease

## 2021-12-14 NOTE — Telephone Encounter (Signed)
Dr. Halford Chessman, please advise if you are okay refilling pt's adderall Rx.

## 2021-12-15 MED ORDER — AMPHETAMINE-DEXTROAMPHETAMINE 10 MG PO TABS: 10.0000 mg | ORAL_TABLET | Freq: Two times a day (BID) | ORAL | 0 refills | Status: DC

## 2021-12-15 NOTE — Telephone Encounter (Signed)
Refill sent.

## 2021-12-15 NOTE — Telephone Encounter (Signed)
Called patient, he answered the phone but the call got disconnected. Called patient again and he did not answer. Left a detailed message for him (ok per DPR).   Will close encounter.

## 2021-12-19 ENCOUNTER — Encounter (HOSPITAL_COMMUNITY): Payer: Self-pay | Admitting: Psychiatry

## 2021-12-19 ENCOUNTER — Ambulatory Visit (HOSPITAL_BASED_OUTPATIENT_CLINIC_OR_DEPARTMENT_OTHER): Payer: Self-pay | Admitting: Psychiatry

## 2021-12-19 VITALS — Wt 153.0 lb

## 2021-12-19 DIAGNOSIS — F4312 Post-traumatic stress disorder, chronic: Secondary | ICD-10-CM

## 2021-12-19 DIAGNOSIS — F331 Major depressive disorder, recurrent, moderate: Secondary | ICD-10-CM
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
## 2021-12-21 ENCOUNTER — Telehealth (HOSPITAL_COMMUNITY): Payer: Self-pay | Admitting: Psychiatry

## 2021-12-21 ENCOUNTER — Ambulatory Visit: Payer: 59 | Admitting: Adult Health

## 2021-12-21 ENCOUNTER — Telehealth: Payer: 59 | Admitting: Emergency Medicine

## 2021-12-21 DIAGNOSIS — J069 Acute upper respiratory infection, unspecified: Secondary | ICD-10-CM | POA: Diagnosis not present

## 2021-12-21 MED ORDER — FLUTICASONE PROPIONATE 50 MCG/ACT NA SUSP: 2.0000 | Freq: Every day | NASAL | 0 refills | Status: DC

## 2021-12-21 MED ORDER — BENZONATATE 100 MG PO CAPS: 100.0000 mg | ORAL_CAPSULE | Freq: Two times a day (BID) | ORAL | 0 refills | Status: DC | PRN

## 2021-12-21 NOTE — Progress Notes (Signed)
E-Visit for Upper Respiratory Infection   We are sorry you are not feeling well.  Here is how we plan to help!  The e-visit can only address your cold symptoms, so if you might consider keeping your appointment this morning and wearing a mask.  Based on what you have shared with me, it looks like you may have a viral upper respiratory infection.  Upper respiratory infections are caused by a large number of viruses; however, rhinovirus is the most common cause.   Symptoms vary from person to person, with common symptoms including sore throat, cough, fatigue or lack of energy and feeling of general discomfort.  A low-grade fever of up to 100.4 may present, but is often uncommon.  Symptoms vary however, and are closely related to a person's age or underlying illnesses.  The most common symptoms associated with an upper respiratory infection are nasal discharge or congestion, cough, sneezing, headache and pressure in the ears and face.  These symptoms usually persist for about 3 to 10 days, but can last up to 2 weeks.  It is important to know that upper respiratory infections do not cause serious illness or complications in most cases.    Upper respiratory infections can be transmitted from person to person, with the most common method of transmission being a person's hands.  The virus is able to live on the skin and can infect other persons for up to 2 hours after direct contact.  Also, these can be transmitted when someone coughs or sneezes; thus, it is important to cover the mouth to reduce this risk.  To keep the spread of the illness at bay, good hand hygiene is very important.  This is an infection that is most likely caused by a virus. There are no specific treatments other than to help you with the symptoms until the infection runs its course.  We are sorry you are not feeling well.  Here is how we plan to help!   For nasal congestion, you may use an oral decongestants such as Mucinex D or if you  have glaucoma or high blood pressure use plain Mucinex.  Saline nasal spray or nasal drops can help and can safely be used as often as needed for congestion.  For your congestion, I have prescribed Fluticasone nasal spray one spray in each nostril twice a day  If you do not have a history of heart disease, hypertension, diabetes or thyroid disease, prostate/bladder issues or glaucoma, you may also use Sudafed to treat nasal congestion.  It is highly recommended that you consult with a pharmacist or your primary care physician to ensure this medication is safe for you to take.     If you have a cough, you may use cough suppressants such as Delsym and Robitussin.  If you have glaucoma or high blood pressure, you can also use Coricidin HBP.   For cough I have prescribed for you A prescription cough medication called Tessalon Perles 100 mg. You may take 1-2 capsules every 8 hours as needed for cough  If you have a sore or scratchy throat, use a saltwater gargle-  to  teaspoon of salt dissolved in a 4-ounce to 8-ounce glass of warm water.  Gargle the solution for approximately 15-30 seconds and then spit.  It is important not to swallow the solution.  You can also use throat lozenges/cough drops and Chloraseptic spray to help with throat pain or discomfort.  Warm or cold liquids can also be helpful in  relieving throat pain.  For headache, pain or general discomfort, you can use Ibuprofen or Tylenol as directed.   Some authorities believe that zinc sprays or the use of Echinacea may shorten the course of your symptoms.   HOME CARE Only take medications as instructed by your medical team. Be sure to drink plenty of fluids. Water is fine as well as fruit juices, sodas and electrolyte beverages. You may want to stay away from caffeine or alcohol. If you are nauseated, try taking small sips of liquids. How do you know if you are getting enough fluid? Your urine should be a pale yellow or almost  colorless. Get rest. Taking a steamy shower or using a humidifier may help nasal congestion and ease sore throat pain. You can place a towel over your head and breathe in the steam from hot water coming from a faucet. Using a saline nasal spray works much the same way. Cough drops, hard candies and sore throat lozenges may ease your cough. Avoid close contacts especially the very young and the elderly Cover your mouth if you cough or sneeze Always remember to wash your hands.   GET HELP RIGHT AWAY IF: You develop worsening fever. If your symptoms do not improve within 10 days You develop yellow or green discharge from your nose over 3 days. You have coughing fits You develop a severe head ache or visual changes. You develop shortness of breath, difficulty breathing or start having chest pain Your symptoms persist after you have completed your treatment plan  MAKE SURE YOU  Understand these instructions. Will watch your condition. Will get help right away if you are not doing well or get worse.  Thank you for choosing an e-visit.  Your e-visit answers were reviewed by a board certified advanced clinical practitioner to complete your personal care plan. Depending upon the condition, your plan could have included both over the counter or prescription medications.  Please review your pharmacy choice. Make sure the pharmacy is open so you can pick up prescription now. If there is a problem, you may contact your provider through [employer] and have the prescription routed to another pharmacy.  Your safety is important to [place]. If you have drug allergies check your prescription carefully.   For the next 24 hours you can use MyChart to ask questions about today's visit, request a non-urgent call back, or ask for a work or school excuse. You will get an email in the next two days asking about your experience. I hope that your e-visit has been valuable and will speed your  recovery.    Approximately 5 minutes was used in reviewing the patient's chart, questionnaire, prescribing medications, and documentation.

## 2021-12-28 ENCOUNTER — Ambulatory Visit (INDEPENDENT_AMBULATORY_CARE_PROVIDER_SITE_OTHER): Payer: 59 | Admitting: Adult Health

## 2021-12-28 ENCOUNTER — Encounter: Payer: Self-pay | Admitting: Adult Health

## 2021-12-28 DIAGNOSIS — G47411 Narcolepsy with cataplexy: Secondary | ICD-10-CM | POA: Diagnosis not present

## 2021-12-28 MED ORDER — AMPHETAMINE-DEXTROAMPHETAMINE 10 MG PO TABS: ORAL_TABLET | ORAL | 0 refills | Status: DC
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
## 2021-12-28 NOTE — Patient Instructions (Addendum)
Increase Adderall 20mg  at 7 am and  10mg  12 noon   Continue on  Klonopin 1mg  At bedtime   Continue on Effexor XR 75mg  daily  Continue follow up psychologist for counseling -stress management.  Follow up with Dr. Halford Chessman  in 4-6  weeks and As needed

## 2021-12-29 NOTE — Assessment & Plan Note (Signed)
Narcolepsy with cataplexy with severe symptom burden.  Patient's medications were adjusted last visit.  He has had a slight improvement in symptoms but continues to have significant daytime sleepiness.  Is unable to perform work duties at this time.  Still has poor quality of life.  Recommend he continue with counseling.  Would continue on current dose of Klonopin.  We will increase Adderall to 20 mg in the a.m. at 7 AM.  And to change his Adderall 10 mg dose to 12 noon.  Continue on Effexor at same dosing.  Plan  Patient Instructions  Increase Adderall 20mg  at 7 am and  10mg  12 noon   Continue on  Klonopin 1mg  At bedtime   Continue on Effexor XR 75mg  daily  Continue follow up psychologist for counseling -stress management.  Follow up with Dr. Halford Chessman  in 4-6  weeks and As needed

## 2021-12-29 NOTE — Progress Notes (Signed)
Reviewed and agree with assessment/plan.   Chesley Mires, MD [facility] Pulmonary/Critical Care 12/29/2021, 4:52 PM Pager:  [phone]

## 2022-01-01 ENCOUNTER — Telehealth: Payer: Self-pay | Admitting: Adult Health

## 2022-01-01 ENCOUNTER — Telehealth: Payer: Self-pay | Admitting: Pulmonary Disease

## 2022-01-01 NOTE — Telephone Encounter (Signed)
Spoke to patient regarding his MyChart message.  I let him know we had faxed the updated Disability Physician's statement on 9/28.  He had gotten a text message from [place] on Monday, 10/23, stating they did not have the updated paperwork.  I have faxed the signed form again and included notes from 9/15 and 10/23 appointments - fax# [phone].   We have not received another request for disability update since the last request on 9/18.

## 2022-01-01 NOTE — Telephone Encounter (Signed)
Mychart message sent by pt: Oscar Mercado "Oscar Mercado"  P [facility] (supporting Melvenia Needles, NP) 2 hours ago (7:20 AM)    Good Morning Oscar Mercado. I was messaging you this morning to see if my disability paper could be updated and submitted. I had to submit paperwork after our September appointment. One of the nurse assistants(forget which one) called and asked for clarification on a few questions and was very helpful. Thank you in advance for you timely response and help in the matter     Routing to [place].

## 2022-01-25 ENCOUNTER — Encounter: Payer: Self-pay | Admitting: Adult Health

## 2022-01-25 ENCOUNTER — Ambulatory Visit (INDEPENDENT_AMBULATORY_CARE_PROVIDER_SITE_OTHER): Payer: 59 | Admitting: Adult Health

## 2022-01-25 VITALS — BP 106/80 | HR 76 | Temp 98.2°F | Ht 66.0 in | Wt 149.4 lb

## 2022-01-25 DIAGNOSIS — G47411 Narcolepsy with cataplexy: Secondary | ICD-10-CM | POA: Diagnosis not present

## 2022-01-25 MED ORDER — CLONAZEPAM 1 MG PO TABS: 1.0000 mg | ORAL_TABLET | Freq: Every day | ORAL | 0 refills | Status: DC

## 2022-01-25 NOTE — Assessment & Plan Note (Addendum)
Narcolepsy with cataplexy with severe symptom burden.  Has been difficult to control symptoms.  For now we will continue on Adderall 20 mg daily at 7 AM and Adderall 10 mg at noon.  Continue Effexor and Klonopin dosing.  Highly recommend continued with ongoing counseling. We discussed possible referral to tertiary center-we will hold on that for now. ? Taking adderall as prescribed , PMP and Pharmacy do not show consistent refills. Encouraged on med compliance   Plan  Patient Instructions  Continue on Adderall 20mg  at 7 am and  10mg  12 noon   Continue on  Klonopin 1mg  At bedtime   Continue on Effexor XR 75mg  daily  Continue follow up psychologist for counseling -stress management.  Follow up with Dr.  in 2 months and As needed

## 2022-01-25 NOTE — Progress Notes (Signed)
@Patient  ID: Oscar Mercado, male    DOB: April 25, 1980, 41 y.o.   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  Patient is followed for narcolepsy with cataplexy.  He continues to have significant symptom burden with ongoing daytime sleepiness and frequent episodes of cataplexy.  Last visit patient's Adderall was increased to 20 mg at 7 AM and 10 mg at noon.  He is on Klonopin 1 mg at bedtime and Effexor XR 75 mg daily.  He is also following up with psychology for counseling and stress management-has not been able to get in lately due to scheduling issues.   Since last visit patient is feeling he has not seen any improvement. Gets sleepy during daytime frequently, bedtime sleep is fragmented and has near cataplexy episodes when he gets upset. Still gets sleepy especially driving, can not drive long distance without nodding off.  Still unable to work , has been out of work since January with hip issue and extended due to Narcolepsy since April 2023.  Weight is stable . No Headaches , chest pain or palpitations.  PMP showed refills from August 25, 2021 and December 14, 2021 for Adderall  Prescription for Adderall dose change from last visit was not filled.  Called pharmacy to confirm.  PMP was correct.       No Known Allergies  There is no immunization history for the selected administration types on file for this patient.  Past Medical History:  Diagnosis Date   Carpal tunnel syndrome    bilateral   Narcolepsy and cataplexy    PTSD  (post-traumatic stress disorder)    Stye     Tobacco History: Social History   Tobacco Use  Smoking Status Former   Types: Cigars  Smokeless Tobacco Never  Tobacco Comments   11/20/21- down to once or twice a week depending on stress levels   8 blacks a week. Tay   Counseling given: Not Answered Tobacco comments: 11/20/21- down to once or twice a week depending on stress levels 8 blacks a week. Tay   Outpatient Medications Prior to Visit  Medication Sig Dispense Refill   amphetamine-dextroamphetamine (ADDERALL) 10 MG tablet Take 2 tabs at 7 am and 1 tab at 12 Noon. 90 tablet 0   venlafaxine XR (EFFEXOR-XR) 75 MG 24 hr capsule Take 1 capsule (75 mg total) by mouth daily with breakfast. 30 capsule 5   benzonatate (TESSALON) 100 MG capsule Take 1 capsule (100 mg total) by mouth 2 (two) times daily as needed for cough. (Patient not taking: Reported on 01/25/2022) 20 capsule 0   clonazePAM (KLONOPIN) 1 MG tablet Take 1 tablet (1 mg total) by mouth 2 (two) times daily. 30 tablet 3   fluticasone (FLONASE) 50 MCG/ACT nasal spray Place 2 sprays into both nostrils daily. (Patient not taking: Reported on 01/25/2022) 9.9 mL 0   methocarbamol (ROBAXIN) 500 MG tablet Take 1 tablet (500 mg total) by mouth 2 (two) times daily as needed. (Patient not taking: Reported on 01/25/2022) 20 tablet 0  No facility-administered medications prior to visit.     Review of Systems:   Constitutional:   No  weight loss, night sweats,  Fevers, chills,  +fatigue, or  lassitude.  HEENT:   No headaches,  Difficulty swallowing,  Tooth/dental problems, or  Sore throat,                No sneezing, itching, ear ache, nasal congestion, post nasal drip,   CV:  No chest pain,  Orthopnea, PND, swelling in lower extremities, anasarca, dizziness, palpitations, syncope.   GI  No heartburn, indigestion, abdominal pain, nausea, vomiting, diarrhea, change in bowel habits, loss of appetite, bloody stools.   Resp: No  shortness of breath with exertion or at rest.  No excess mucus, no productive cough,  No non-productive cough,  No coughing up of blood.  No change in color of mucus.  No wheezing.  No chest wall deformity  Skin: no rash or lesions.  GU: no dysuria, change in color of urine, no urgency or frequency.  No flank pain, no hematuria   MS:  No joint pain or swelling.  No decreased range of motion.  No back pain.    Physical Exam  BP 106/80 (BP Location: Left Arm, Patient Position: Sitting, Cuff Size: Normal)   Pulse 76   Temp 98.2 F (36.8 C) (Oral)   Ht 5\' 6"  (1.676 m)   Wt 149 lb 6.4 oz (67.8 kg)   SpO2 98%   BMI 24.11 kg/m   GEN: A/Ox3; pleasant , NAD, well nourished    HEENT:  /AT,  EACs-clear, TMs-wnl, NOSE-clear, THROAT-clear, no lesions, no postnasal drip or exudate noted.   NECK:  Supple w/ fair ROM; no JVD; normal carotid impulses w/o bruits; no thyromegaly or nodules palpated; no lymphadenopathy.    RESP  Clear  P & A; w/o, wheezes/ rales/ or rhonchi. no accessory muscle use, no dullness to percussion  CARD:  RRR, no m/r/g, no peripheral edema, pulses intact, no cyanosis or clubbing.  GI:   Soft & nt; nml bowel sounds; no organomegaly or masses detected.   Musco: Warm bil, no deformities or joint swelling noted.   Neuro: alert, no focal deficits noted.    Skin: Warm, no lesions or rashes    Lab Results:  CBC No results found for: "WBC", "RBC", "HGB", "HCT", "PLT", "MCV", "MCH", "MCHC", "RDW", "LYMPHSABS", "MONOABS", "EOSABS", "BASOSABS"  BMET No results found for: "NA", "K", "CL", "CO2", "GLUCOSE", "BUN", "CREATININE", "CALCIUM", "GFRNONAA", "GFRAA"  BNP No results found for: "BNP"  ProBNP No results found for: "PROBNP"  Imaging: No results found.        No data to display          No results found for: "NITRICOXIDE"      Assessment & Plan:   Narcolepsy with cataplexy Narcolepsy with cataplexy with severe symptom burden.  Has been  difficult to control symptoms.  For now we will continue on Adderall 20 mg daily at 7 AM and Adderall 10 mg at noon.  Continue Effexor and Klonopin dosing.  Highly recommend continued with ongoing counseling. We discussed possible referral to tertiary center-we will hold on that for now. ? Taking adderall as prescribed , PMP and Pharmacy do not show consistent refills. Encouraged on med compliance   Plan  Patient Instructions  Continue on Adderall 20mg  at 7 am and  10mg  12 noon   Continue on  Klonopin 1mg  At bedtime   Continue on Effexor XR 75mg  daily  Continue follow up psychologist for counseling -stress management.  Follow up with Dr. Craige Cotta  in 2 months and As needed      I spent 32   minutes dedicated to the care of this patient on the date of this encounter to include pre-visit review of records, face-to-face time with the patient discussing conditions above, post visit ordering of testing, clinical documentation with the electronic health record, making appropriate referrals as documented, and communicating necessary findings to members of the patients care team.    Rubye Oaks, NP 01/25/2022

## 2022-01-25 NOTE — Patient Instructions (Addendum)
Continue on Adderall 20mg  at 7 am and  10mg  12 noon   Continue on  Klonopin 1mg  At bedtime   Continue on Effexor XR 75mg  daily  Continue follow up psychologist for counseling -stress management.  Follow up with Dr.  in 2 months and As needed

## 2022-01-26 ENCOUNTER — Telehealth: Payer: Self-pay | Admitting: *Deleted

## 2022-01-26 NOTE — Telephone Encounter (Signed)
Mychart message sent to patient.  [facility] pharmacy [facility] [address]), I verified that the patient has a prescription that was sent in on 12/28/2021, 10 mg tabs (take 2 tabs), 90 tablets.  Patient has not picked up the prescription.  ATC patient X2, line rings and then goes to a fast busy signal.

## 2022-01-27 ENCOUNTER — Telehealth: Payer: Self-pay | Admitting: Pulmonary Disease

## 2022-01-27 NOTE — Telephone Encounter (Signed)
Received a request for update to disability paperwork and a copy of 11/20 office notes.  Completed form and sent to T. Parrett for signature on 11/27.  Dr. Craige Cotta will not be available at [facility] until 12/4.

## 2022-01-29 NOTE — Telephone Encounter (Signed)
See encounter from 10/27. Will close this encounter.

## 2022-02-01 NOTE — Telephone Encounter (Signed)
PMP shows he picked up on 01/27/22

## 2022-02-09 NOTE — Telephone Encounter (Signed)
Faxed copy of signed disability form to [facility] - fax# [phone] on 11/30.  Included 11/20 office notes.  Mailed hard copy to patient.

## 2022-02-10 NOTE — Telephone Encounter (Signed)
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 On 11/28.  I have faxed the form completed by Dr. Craige Cotta to [facility] fax# [phone].  Included 01/25/22 ov notes.

## 2022-03-23 ENCOUNTER — Telehealth: Payer: Self-pay | Admitting: *Deleted

## 2022-03-23 NOTE — Telephone Encounter (Signed)
[employer] paperwork placed in Oscar Mercado's box up front for completion.

## 2022-03-26 ENCOUNTER — Ambulatory Visit (INDEPENDENT_AMBULATORY_CARE_PROVIDER_SITE_OTHER): Payer: 59

## 2022-03-26 ENCOUNTER — Encounter (HOSPITAL_BASED_OUTPATIENT_CLINIC_OR_DEPARTMENT_OTHER): Payer: Self-pay | Admitting: Pulmonary Disease

## 2022-03-26 ENCOUNTER — Ambulatory Visit (INDEPENDENT_AMBULATORY_CARE_PROVIDER_SITE_OTHER): Payer: 59 | Admitting: Pulmonary Disease

## 2022-03-26 VITALS — BP 124/84 | HR 83 | Temp 99.4°F | Ht 65.5 in | Wt 155.6 lb

## 2022-03-26 DIAGNOSIS — R042 Hemoptysis: Secondary | ICD-10-CM

## 2022-03-26 DIAGNOSIS — G47411 Narcolepsy with cataplexy: Secondary | ICD-10-CM

## 2022-03-26 DIAGNOSIS — R5383 Other fatigue: Secondary | ICD-10-CM

## 2022-03-26 DIAGNOSIS — G4752 REM sleep behavior disorder: Secondary | ICD-10-CM

## 2022-03-26 MED ORDER — AMPHETAMINE-DEXTROAMPHETAMINE 30 MG PO TABS: ORAL_TABLET | ORAL | 0 refills | Status: DC

## 2022-03-26 MED ORDER — CLONAZEPAM 1 MG PO TABS
1.5000 mg | ORAL_TABLET | Freq: Every day | ORAL | 5 refills | Status: DC
Start: 2022-03-26 — End: 2022-05-24

## 2022-03-26 NOTE — Progress Notes (Signed)
[facility]  Chief Complaint  Patient presents with   Follow-up    Pt states he has stopped meds for about 3 weeks right now.     Past Surgical History:  He  has a past surgical history that includes ORIF mandibular fracture.  Past Medical History:  PSTD, Mandibular fracture  Constitutional:  BP 124/84 (BP Location: Right Arm, Patient Position: Sitting, Cuff Size: Normal)   Pulse 83   Temp 99.4 F (37.4 C) (Oral)   Ht 5' 5.5" (1.664 m)   Wt 155 lb 9.6 oz (70.6 kg)   SpO2 97%   BMI 25.50 kg/m   Brief Summary:  Oscar Mercado is a 42 y.o. 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      Subjective:   He started coughing up phlegm with blood a few weeks ago.  He was worried this was coming from his medicine, so he stopped these.  He is still having cough.  Not having chest pain, fever, sweats, weight loss, nose bleeds, wheeze.  He is struggling to stay awake during the day.  Venlafaxine made him more sleepy; he was taking this in the morning.  He is worried this is causing his hemoptysis.  He feels the other medicines helped some, but he needs a higher dose.  He was also worried about his testosterone level being low and whether this is contributing to his fatigue.  Physical Exam:   Appearance - well kempt   ENMT - no sinus tenderness, no oral exudate, no LAN, Mallampati 3 airway, no stridor  Respiratory - equal breath sounds bilaterally, no wheezing or rales  CV - s1s2 regular rate and rhythm, no murmurs  Ext - no clubbing, no edema  Skin - no rashes  Psych - normal mood and affect    Sleep Tests:  PSG 11/06/18 >> SO 0.4 minutes, total sleep time 394.5 minutes, frequent REM with fragmented sleep, AHI 0.6, SpO2 low 89%, PLMI 0 MSLT 11/08/18 >> 5/5 naps with sleep, mean sleep latency 1 second, 4/5 naps with SOREM  Social History:  He  reports that he has quit smoking. His smoking use included cigars. He  has never used smokeless tobacco. He reports current alcohol use. He reports current drug use. Drug: Marijuana.  Family History:  His family history includes Diabetes in his maternal grandmother.     Assessment/Plan:   Narcolepsy with cataplexy. - will increase adderall to 30 mg in the morning and 15 mg in the afternoon. - he would like to defer restarting venlafaxine for now - if he continues to had difficulty, then might need referral to get started on an oxybate - he had trouble tolerating armodafinil due to palpitations - discussed importance of regular sleep-wake schedule, and scheduled brief naps during the day - reviewed proper sleep hygiene  REM/Non REM parasomnias. - increase klonopin to 1.5 mg nightly  Hemoptysis. - chest xray today  Fatigue. - check testosterone level  Time Spent Involved in Patient Care on Day of Examination:  36 minutes  Follow up:   Patient Instructions  Chest xray and lab work today  Adderall 30 mg in the morning and 15 mg after lunch  Klonopin 1.5 mg nightly  Follow up in 2 months  Medication List:   Allergies as of 03/26/2022   No Known Allergies      Medication List        Accurate as of March 26, 2022 10:44 AM. If  you have any questions, ask your nurse or doctor.          STOP taking these medications    amphetamine-dextroamphetamine 10 MG tablet Commonly known as: Adderall Replaced by: amphetamine-dextroamphetamine 30 MG tablet Stopped by: Chesley Mires, MD   venlafaxine XR 75 MG 24 hr capsule Commonly known as: EFFEXOR-XR Stopped by: Chesley Mires, MD       TAKE these medications    amphetamine-dextroamphetamine 30 MG tablet Commonly known as: Adderall Take 1 tablet by mouth every morning AND 0.5 tablets daily after lunch. Replaces: amphetamine-dextroamphetamine 10 MG tablet Started by: Chesley Mires, MD   clonazePAM 1 MG tablet Commonly known as: KLONOPIN Take 1.5 tablets (1.5 mg total) by mouth at  bedtime. What changed: how much to take Changed by: Chesley Mires, MD        Signature:  Chesley Mires, MD [place] Pager - [phone] 03/26/2022, 10:44 AM

## 2022-03-26 NOTE — Patient Instructions (Signed)
Chest xray and lab work today  Adderall 30 mg in the morning and 15 mg after lunch  Klonopin 1.5 mg nightly  Follow up in 2 months

## 2022-03-27 LAB — TESTOSTERONE: Testosterone: 335 ng/dL (ref 264–916)

## 2022-04-02 ENCOUNTER — Encounter (HOSPITAL_BASED_OUTPATIENT_CLINIC_OR_DEPARTMENT_OTHER): Payer: Self-pay | Admitting: Pulmonary Disease

## 2022-04-02 ENCOUNTER — Telehealth: Payer: Self-pay | Admitting: Adult Health

## 2022-04-02 NOTE — Telephone Encounter (Signed)
Paperwork for extension of patient's disability was first received on 1/9.  I have filled out as much of the form as I can and will give it to Rexene Edison, NP for completion and signature.  I also called patient and gave him a status.

## 2022-04-02 NOTE — Telephone Encounter (Signed)
Please review

## 2022-04-02 NOTE — Telephone Encounter (Signed)
Message was sent to [place] which [place] has sent a response back to Tammy.

## 2022-04-02 NOTE — Telephone Encounter (Signed)
I haven't received this.  Can you check with Tammy Parrett or Darilyn.

## 2022-04-02 NOTE — Telephone Encounter (Signed)
Darilyn, please see mychart messages sent by pt as well as attached document in media section of message and advise.

## 2022-04-09 NOTE — Telephone Encounter (Signed)
Signed paperwork for disability extension was faxed to [facility] fax# [phone].  Included office notes from 03/26/2022.  Mailed hard copy to patient.

## 2022-05-19 ENCOUNTER — Encounter (HOSPITAL_BASED_OUTPATIENT_CLINIC_OR_DEPARTMENT_OTHER): Payer: Self-pay | Admitting: Pulmonary Disease

## 2022-05-19 ENCOUNTER — Other Ambulatory Visit (HOSPITAL_BASED_OUTPATIENT_CLINIC_OR_DEPARTMENT_OTHER): Payer: Self-pay | Admitting: Pulmonary Disease

## 2022-05-19 NOTE — Telephone Encounter (Signed)
Please advise on refill request

## 2022-05-19 NOTE — Telephone Encounter (Signed)
Mychart  message sent by pt:  Oscar Mercado "[name]"  P [facility] (supporting Chesley Mires, MD)2 hours ago (12:04 PM)    Hello,   I'm trying to get a prescription refilled for the CLONAZEPAM I'm taking. I can't do it through this portal of My Chart and will need that ASAP. I also put in a prescription refill for the ADDERALL. I just want to make sure that both prescriptions are refilled as I really need both. Thank you.    Dr. Halford Chessman, please advise.

## 2022-05-24 MED ORDER — CLONAZEPAM 1 MG PO TABS: 1.5000 mg | ORAL_TABLET | Freq: Every day | ORAL | 5 refills | Status: DC

## 2022-05-24 MED ORDER — AMPHETAMINE-DEXTROAMPHETAMINE 30 MG PO TABS: ORAL_TABLET | ORAL | 0 refills | Status: DC

## 2022-05-24 NOTE — Telephone Encounter (Signed)
Refill for adderall and klonopin sent.

## 2022-05-31 ENCOUNTER — Ambulatory Visit (INDEPENDENT_AMBULATORY_CARE_PROVIDER_SITE_OTHER): Payer: 59 | Admitting: Pulmonary Disease

## 2022-05-31 ENCOUNTER — Encounter (HOSPITAL_BASED_OUTPATIENT_CLINIC_OR_DEPARTMENT_OTHER): Payer: Self-pay | Admitting: Pulmonary Disease

## 2022-05-31 VITALS — BP 122/68 | HR 63 | Wt 155.0 lb

## 2022-05-31 DIAGNOSIS — G47411 Narcolepsy with cataplexy: Secondary | ICD-10-CM | POA: Diagnosis not present

## 2022-05-31 DIAGNOSIS — G4752 REM sleep behavior disorder: Secondary | ICD-10-CM | POA: Diagnosis not present

## 2022-05-31 MED ORDER — VENLAFAXINE HCL ER 37.5 MG PO CP24: 37.5000 mg | ORAL_CAPSULE | Freq: Every day | ORAL | 5 refills | Status: DC

## 2022-05-31 NOTE — Patient Instructions (Signed)
Venlafaxine one pill in the morning.  We can increase the dose of this after a few weeks if you are still having trouble with cataplexy.  Call when you are due for new adderall script and we will change to extended release formulation.  Call if you need to switch to a different pharmacy to get klonopin.  Follow up in 3 months.

## 2022-05-31 NOTE — Progress Notes (Signed)
[facility]  Chief Complaint  Patient presents with   Follow-up    Pt is her for follow for fatigue. Pt states that he feels like the adderall is not working well for him to stay awake. Pt states that he feels like the klonapin he has grown a dependency to it and this has alterate his sleep. The klonapin has been on back order the past few weeks. Pt states that since he got off the one med he is expecting cataplexy episodes when he has heightened periods of emotions etc. No longer coughing up blood     Past Surgical History:  He  has a past surgical history that includes ORIF mandibular fracture.  Past Medical History:  PSTD, Mandibular fracture  Constitutional:  BP 122/68 (BP Location: Left Arm, Patient Position: Sitting, Cuff Size: Normal)   Pulse 63   Wt 155 lb (70.3 kg)   SpO2 99%   BMI 25.40 kg/m   Brief Summary:  Oscar Mercado is a 42 y.o. 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      Subjective:   No longer coughing blood.  He has more trouble with cataplexy.  Has more stress in his life.  He is expecting another child.  He wasn't able to get klonopin from his pharmacy - told it is on back order.  Sleep much worse without klonopin.  Now can only sleep one to two hours at a stretch.  Feeling more tired during the day.  Physical Exam:   Appearance - well kempt   ENMT - no sinus tenderness, no oral exudate, no LAN, Mallampati 3 airway, no stridor  Respiratory - equal breath sounds bilaterally, no wheezing or rales  CV - s1s2 regular rate and rhythm, no murmurs  Ext - no clubbing, no edema  Skin - no rashes  Psych - normal mood and affect    Sleep Tests:  PSG 11/06/18 >> SO 0.4 minutes, total sleep time 394.5 minutes, frequent REM with fragmented sleep, AHI 0.6, SpO2 low 89%, PLMI 0 MSLT 11/08/18 >> 5/5 naps with sleep, mean sleep latency 1 second, 4/5 naps with SOREM  Social History:  He  reports  that he has quit smoking. His smoking use included cigars. He has never used smokeless tobacco. He reports current alcohol use. He reports current drug use. Drug: Marijuana.  Family History:  His family history includes Diabetes in his maternal grandmother.     Assessment/Plan:   Narcolepsy with cataplexy. - armodafinil caused palpitations - continue adderall 30 mg in the morning and 15 mg in the afternoon for now - will change to extended release adderall when he is due for next refill - will restart extended release venlafaxine for cataplexy at 37.5 mg per day, and can increase this to 150 mg per day as needed; he will call if he develops hemoptysis again while taking this - if he continues to had difficulty, then might need referral to get started on an oxybate, but would need referral to tertiary center for this - discussed importance of regular sleep-wake schedule, and scheduled brief naps during the day - reviewed proper sleep hygiene  REM/Non REM parasomnias. - he is to use klonopin 1.5 mg nightly - he will call to switch pharmacies if he has trouble getting klonopin  Time Spent Involved in Patient Care on Day of Examination:  27 minutes  Follow up:   Patient Instructions  Venlafaxine one pill in the morning.  We can increase the dose of this after a few weeks if you are still having trouble with cataplexy.  Call when you are due for new adderall script and we will change to extended release formulation.  Call if you need to switch to a different pharmacy to get klonopin.  Follow up in 3 months.  Medication List:   Allergies as of 05/31/2022   No Known Allergies      Medication List        Accurate as of May 31, 2022 10:15 AM. If you have any questions, ask your nurse or doctor.          amphetamine-dextroamphetamine 30 MG tablet Commonly known as: Adderall Take 1 tablet by mouth every morning AND 0.5 tablets daily after lunch.   clonazePAM 1 MG  tablet Commonly known as: KLONOPIN Take 1.5 tablets (1.5 mg total) by mouth at bedtime.   venlafaxine XR 37.5 MG 24 hr capsule Commonly known as: EFFEXOR-XR Take 1 capsule (37.5 mg total) by mouth daily with breakfast. Started by: Chesley Mires, MD        Signature:  Chesley Mires, MD [place] Pager - [phone] 05/31/2022, 10:15 AM

## 2022-06-02 ENCOUNTER — Encounter (HOSPITAL_BASED_OUTPATIENT_CLINIC_OR_DEPARTMENT_OTHER): Payer: Self-pay | Admitting: Pulmonary Disease

## 2022-06-14 NOTE — Telephone Encounter (Signed)
Darilyn can you please provide an update?

## 2022-06-16 ENCOUNTER — Telehealth: Payer: Self-pay | Admitting: Adult Health

## 2022-06-16 NOTE — Telephone Encounter (Signed)
I spoke to patient's insurance company on 4/9 and received a one week extension for submittal of the long term disability form.  I then faxed the most recent office notes for 1/19 and 05/21/22 to [facility] - fax# [phone].  I called and spoke to the patient and explained what I had done.  The disability form was signed today by Rubye Oaks, NP and I have faxed it to [facility] at the same fax number and mailed a hard copy to the patient.

## 2022-06-16 NOTE — Telephone Encounter (Signed)
Noted  

## 2022-06-29 ENCOUNTER — Other Ambulatory Visit (HOSPITAL_BASED_OUTPATIENT_CLINIC_OR_DEPARTMENT_OTHER): Payer: Self-pay | Admitting: Pulmonary Disease

## 2022-06-29 ENCOUNTER — Encounter (HOSPITAL_BASED_OUTPATIENT_CLINIC_OR_DEPARTMENT_OTHER): Payer: Self-pay | Admitting: Pulmonary Disease

## 2022-07-02 NOTE — Telephone Encounter (Signed)
Please advise 

## 2022-07-05 MED ORDER — CLONAZEPAM 1 MG PO TABS: 1.5000 mg | ORAL_TABLET | Freq: Every day | ORAL | 5 refills | Status: DC

## 2022-07-05 MED ORDER — VENLAFAXINE HCL ER 37.5 MG PO CP24: 37.5000 mg | ORAL_CAPSULE | Freq: Every day | ORAL | 5 refills | Status: DC

## 2022-07-05 MED ORDER — AMPHETAMINE-DEXTROAMPHETAMINE 30 MG PO TABS: ORAL_TABLET | ORAL | 0 refills | Status: DC

## 2022-07-05 NOTE — Telephone Encounter (Signed)
Refills sent

## 2022-07-15 MED ORDER — AMPHETAMINE-DEXTROAMPHETAMINE 30 MG PO TABS: ORAL_TABLET | ORAL | 0 refills | Status: DC

## 2022-08-16 ENCOUNTER — Other Ambulatory Visit (HOSPITAL_BASED_OUTPATIENT_CLINIC_OR_DEPARTMENT_OTHER): Payer: Self-pay | Admitting: Pulmonary Disease

## 2022-08-16 ENCOUNTER — Encounter (HOSPITAL_BASED_OUTPATIENT_CLINIC_OR_DEPARTMENT_OTHER): Payer: Self-pay | Admitting: Pulmonary Disease

## 2022-08-18 MED ORDER — CLONAZEPAM 1 MG PO TABS: 1.5000 mg | ORAL_TABLET | Freq: Every day | ORAL | 5 refills | Status: DC

## 2022-08-18 MED ORDER — VENLAFAXINE HCL ER 37.5 MG PO CP24: 37.5000 mg | ORAL_CAPSULE | Freq: Every day | ORAL | 5 refills | Status: DC

## 2022-08-18 MED ORDER — AMPHETAMINE-DEXTROAMPHETAMINE 30 MG PO TABS: ORAL_TABLET | ORAL | 0 refills | Status: DC

## 2022-08-18 NOTE — Telephone Encounter (Signed)
Refills sent

## 2022-09-02 ENCOUNTER — Other Ambulatory Visit (HOSPITAL_BASED_OUTPATIENT_CLINIC_OR_DEPARTMENT_OTHER): Payer: Self-pay | Admitting: Pulmonary Disease

## 2022-09-02 NOTE — Telephone Encounter (Signed)
Patient states completely out of [place] since [place] pharmacy was out of stock and still has yet to regain stock for patients refill. Patient requesting to use any pharmacy that has it. Please advise and call patient if needed.  Pharm options:   [facility]: [phone]  [facility]   P: [phone]

## 2022-09-03 MED ORDER — AMPHETAMINE-DEXTROAMPHETAMINE 30 MG PO TABS: ORAL_TABLET | ORAL | 0 refills | Status: DC

## 2022-09-03 NOTE — Telephone Encounter (Signed)
Called and spoke with patient. He stated that [facility] does not have his Adderall 30mg  in stock. He would like to have the RX sent to [facility] on [address].   I have pended the order. Dr. Craige Cotta, would you will be willing to send in the RX for him? Thanks!

## 2022-09-21 NOTE — Telephone Encounter (Signed)
Checking to see if this was handled for patient. If so, will close encounter. Please advise.

## 2022-09-27 ENCOUNTER — Encounter (HOSPITAL_BASED_OUTPATIENT_CLINIC_OR_DEPARTMENT_OTHER): Payer: Self-pay | Admitting: Pulmonary Disease

## 2022-09-27 ENCOUNTER — Ambulatory Visit (HOSPITAL_BASED_OUTPATIENT_CLINIC_OR_DEPARTMENT_OTHER): Payer: 59 | Admitting: Pulmonary Disease

## 2022-09-27 VITALS — BP 118/72 | HR 81 | Resp 20 | Ht 65.5 in | Wt 152.6 lb

## 2022-09-27 DIAGNOSIS — G4759 Other parasomnia: Secondary | ICD-10-CM

## 2022-09-27 DIAGNOSIS — G47411 Narcolepsy with cataplexy: Secondary | ICD-10-CM | POA: Diagnosis not present

## 2022-09-27 DIAGNOSIS — G4752 REM sleep behavior disorder: Secondary | ICD-10-CM

## 2022-09-27 MED ORDER — VENLAFAXINE HCL ER 75 MG PO CP24: 75.0000 mg | ORAL_CAPSULE | Freq: Every day | ORAL | 5 refills | Status: DC

## 2022-09-27 MED ORDER — AMPHETAMINE-DEXTROAMPHETAMINE 30 MG PO TABS: 30.0000 mg | ORAL_TABLET | Freq: Two times a day (BID) | ORAL | 0 refills | Status: DC

## 2022-09-27 NOTE — Progress Notes (Signed)
[facility]  Chief Complaint  Patient presents with   Follow-up    3 month follow  up, feeling like the medication isn't helping , discuss getting another medication to help with  sleep , because of narcolepsy he is looking to file disability     Past Surgical History:  He  has a past surgical history that includes ORIF mandibular fracture.  Past Medical History:  PSTD, Mandibular fracture  Constitutional:  BP 118/72   Pulse 81   Resp 20   Ht 5' 5.5" (1.664 m)   Wt 152 lb 9.6 oz (69.2 kg)   SpO2 96%   BMI 25.01 kg/m   Brief Summary:  Oscar Mercado is a 42 y.o. 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      Subjective:   He continues to struggle with his sleep and daytime sleepiness.  He continues to have episodes of cataplexy.  This seems to happen more when he is stressed by his kids.  His wife is also pregnant again.  He isn't able to keep up with work requirements, and is worried about how he will be able to support his family.  He is applying for disability and has contacted a Clinical research associate.  Physical Exam:   Appearance - well kempt   ENMT - no sinus tenderness, no oral exudate, no LAN, Mallampati 3 airway, no stridor  Respiratory - equal breath sounds bilaterally, no wheezing or rales  CV - s1s2 regular rate and rhythm, no murmurs  Ext - no clubbing, no edema  Skin - no rashes  Psych - normal mood and affect    Sleep Tests:  PSG 11/06/18 >> SO 0.4 minutes, total sleep time 394.5 minutes, frequent REM with fragmented sleep, AHI 0.6, SpO2 low 89%, PLMI 0 MSLT 11/08/18 >> 5/5 naps with sleep, mean sleep latency 1 second, 4/5 naps with SOREM  Social History:  He  reports that he has quit smoking. His smoking use included cigars. He has never used smokeless tobacco. He reports current alcohol use. He reports current drug use. Drug: Marijuana.  Family History:  His family history includes Diabetes in his  maternal grandmother.     Assessment/Plan:   Narcolepsy with cataplexy. - armodafinil caused palpitations - still having significant daytime sleepiness and frequent episodes of cataplexy impacting his ability to function during the day - increase adderall to 30 mg bid - increase venlafaxine to 75 mg daily - if he continues to had difficulty, then might need referral to get started on an oxybate, but would need referral to tertiary center for this - discussed importance of regular sleep-wake schedule, and scheduled brief naps during the day - reviewed proper sleep hygiene  REM/Non REM parasomnias. - this has improved with therapy - continue klonopin 1.5 mg nightly  Time Spent Involved in Patient Care on Day of Examination:  28 minutes  Follow up:   Patient Instructions  Adderall 30 mg twice per day  Venlafaxine 75 mg daily  Klonopin 1.5 mg nightly  Follow up in 2 months  Medication List:   Allergies as of 09/27/2022   No Known Allergies      Medication List        Accurate as of September 27, 2022  1:48 PM. If you have any questions, ask your nurse or doctor.          amphetamine-dextroamphetamine 30 MG tablet Commonly known as: Adderall Take 1 tablet by mouth 2 (two)  times daily. What changed:  See the new instructions. Another medication with the same name was removed. Continue taking this medication, and follow the directions you see here. Changed by: Coralyn Helling   clonazePAM 1 MG tablet Commonly known as: KLONOPIN Take 1.5 tablets (1.5 mg total) by mouth at bedtime.   venlafaxine XR 75 MG 24 hr capsule Commonly known as: Effexor XR Take 1 capsule (75 mg total) by mouth daily with breakfast. What changed:  medication strength how much to take Changed by: Coralyn Helling        Signature:  Coralyn Helling, MD  Pulmonary/Critical Care Pager - [phone] 09/27/2022, 1:48 PM

## 2022-09-27 NOTE — Patient Instructions (Addendum)
Adderall 30 mg twice per day  Venlafaxine 75 mg daily  Klonopin 1.5 mg nightly  Follow up in 2 months

## 2022-11-04 ENCOUNTER — Other Ambulatory Visit (HOSPITAL_BASED_OUTPATIENT_CLINIC_OR_DEPARTMENT_OTHER): Payer: Self-pay | Admitting: Pulmonary Disease

## 2022-11-05 MED ORDER — AMPHETAMINE-DEXTROAMPHETAMINE 30 MG PO TABS: 30.0000 mg | ORAL_TABLET | Freq: Two times a day (BID) | ORAL | 0 refills | Status: DC

## 2022-11-25 ENCOUNTER — Ambulatory Visit (INDEPENDENT_AMBULATORY_CARE_PROVIDER_SITE_OTHER): Payer: 59 | Admitting: Pulmonary Disease

## 2022-11-25 ENCOUNTER — Encounter (HOSPITAL_BASED_OUTPATIENT_CLINIC_OR_DEPARTMENT_OTHER): Payer: Self-pay | Admitting: Pulmonary Disease

## 2022-11-25 VITALS — BP 122/68 | HR 74 | Ht 65.5 in | Wt 151.0 lb

## 2022-11-25 DIAGNOSIS — G4752 REM sleep behavior disorder: Secondary | ICD-10-CM | POA: Diagnosis not present

## 2022-11-25 DIAGNOSIS — G47411 Narcolepsy with cataplexy: Secondary | ICD-10-CM | POA: Diagnosis not present

## 2022-11-25 DIAGNOSIS — G4759 Other parasomnia: Secondary | ICD-10-CM

## 2022-11-25 MED ORDER — VENLAFAXINE HCL ER 150 MG PO CP24: 150.0000 mg | ORAL_CAPSULE | Freq: Every day | ORAL | 3 refills | Status: AC

## 2022-11-25 MED ORDER — WAKIX 17.8 MG PO TABS: 35.6000 mg | ORAL_TABLET | Freq: Every day | ORAL | Status: DC

## 2022-11-25 NOTE — Progress Notes (Signed)
[facility]  Chief Complaint  Patient presents with   narcolepsy    Past Surgical History:  He  has a past surgical history that includes ORIF mandibular fracture.  Past Medical History:  PSTD, Mandibular fracture  Constitutional:  BP 122/68   Pulse 74   Ht 5' 5.5" (1.664 m)   Wt 151 lb (68.5 kg)   SpO2 96%   BMI 24.75 kg/m   Brief Summary:  Oscar Mercado is a 42 y.o. 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      Subjective:   His wife had a baby.  He has to drive his kids to school now.  He gets nervous about staying awake while driving.  Still having episodes of cataplexy.  Feels more depressed about being dependent on medications.  Physical Exam:   Appearance - well kempt   ENMT - no sinus tenderness, no oral exudate, no LAN, Mallampati 3 airway, no stridor  Respiratory - equal breath sounds bilaterally, no wheezing or rales  CV - s1s2 regular rate and rhythm, no murmurs  Ext - no clubbing, no edema  Skin - no rashes  Psych - normal mood and affect    Sleep Tests:  PSG 11/06/18 >> SO 0.4 minutes, total sleep time 394.5 minutes, frequent REM with fragmented sleep, AHI 0.6, SpO2 low 89%, PLMI 0 MSLT 11/08/18 >> 5/5 naps with sleep, mean sleep latency 1 second, 4/5 naps with SOREM  Social History:  He  reports that he has quit smoking. His smoking use included cigars. He has never used smokeless tobacco. He reports current alcohol use. He reports current drug use. Drug: Marijuana.  Family History:  His family history includes Diabetes in his maternal grandmother.     Assessment/Plan:   Narcolepsy with cataplexy. - armodafinil caused palpitations - still having significant daytime sleepiness and frequent episodes of cataplexy impacting his ability to function during the day - start process to get approval for wakix - continue adderall 30 mg bid - increase venlafaxine to 150 mg daily -  discussed importance of regular sleep-wake schedule, and scheduled brief naps during the day - reviewed proper sleep hygiene - driving precautions discussed  REM/Non REM parasomnias. - this has improved with therapy - continue klonopin 1.5 mg nightly  Time Spent Involved in Patient Care on Day of Examination:  36 minutes  Follow up:   Patient Instructions  Will start approval process for Wakix  Will increase venlafaxine to 150 mg daily  Follow up in 4 weeks with Dr. Vassie Loll or Tammy Parrett  Medication List:   Allergies as of 11/25/2022   No Known Allergies      Medication List        Accurate as of November 25, 2022  9:41 AM. If you have any questions, ask your nurse or doctor.          amphetamine-dextroamphetamine 30 MG tablet Commonly known as: Adderall Take 1 tablet by mouth 2 (two) times daily.   clonazePAM 1 MG tablet Commonly known as: KLONOPIN Take 1.5 tablets (1.5 mg total) by mouth at bedtime.   venlafaxine XR 150 MG 24 hr capsule Commonly known as: EFFEXOR-XR Take 1 capsule (150 mg total) by mouth daily with breakfast. What changed:  medication strength how much to take Changed by: Chastity Noland   Wakix 17.8 MG Tabs Generic drug: Pitolisant HCl Take 35.6 mg by mouth daily at 2 am. Started by: Coralyn Helling  Signature:  Coralyn Helling, MD [facility] Pulmonary/Critical Care Pager - [phone] 11/25/2022, 9:41 AM

## 2022-11-25 NOTE — Patient Instructions (Signed)
Will start approval process for Wak[employer]  Will increase venlafaxine to 150 mg daily  Follow up in 4 weeks with Dr. Vassie Loll or Tammy Parrett

## 2022-12-24 ENCOUNTER — Other Ambulatory Visit: Payer: Self-pay

## 2022-12-24 DIAGNOSIS — G47411 Narcolepsy with cataplexy: Secondary | ICD-10-CM

## 2022-12-24 MED ORDER — AMPHETAMINE-DEXTROAMPHETAMINE 30 MG PO TABS
30.0000 mg | ORAL_TABLET | Freq: Two times a day (BID) | ORAL | 0 refills | Status: DC
Start: 2022-12-24 — End: 2023-01-28

## 2022-12-24 MED ORDER — CLONAZEPAM 1 MG PO TABS
1.5000 mg | ORAL_TABLET | Freq: Every day | ORAL | 5 refills | Status: DC
Start: 2022-12-24 — End: 2023-01-28

## 2022-12-24 NOTE — Telephone Encounter (Signed)
Reviewed Dr. Silverio Lay note, ok to refill

## 2022-12-24 NOTE — Telephone Encounter (Signed)
Patient requesting refill for Klonopin 1mg  and Adderall 30mg    LOV 11/25/22 with Dr.Sood   Beth can you please advise he will be seeing you on 01/10/2023  Thank you

## 2023-01-10 ENCOUNTER — Ambulatory Visit: Payer: 59 | Admitting: Primary Care

## 2023-01-18 ENCOUNTER — Ambulatory Visit: Payer: 59 | Admitting: Primary Care

## 2023-01-28 ENCOUNTER — Telehealth (HOSPITAL_BASED_OUTPATIENT_CLINIC_OR_DEPARTMENT_OTHER): Payer: 59 | Admitting: Pulmonary Disease

## 2023-01-28 ENCOUNTER — Telehealth (HOSPITAL_BASED_OUTPATIENT_CLINIC_OR_DEPARTMENT_OTHER): Payer: Self-pay | Admitting: Pulmonary Disease

## 2023-01-28 ENCOUNTER — Encounter (HOSPITAL_BASED_OUTPATIENT_CLINIC_OR_DEPARTMENT_OTHER): Payer: Self-pay | Admitting: Pulmonary Disease

## 2023-01-28 VITALS — Ht 65.5 in | Wt 151.0 lb

## 2023-01-28 DIAGNOSIS — G47411 Narcolepsy with cataplexy: Secondary | ICD-10-CM | POA: Diagnosis not present

## 2023-01-28 MED ORDER — AMPHETAMINE-DEXTROAMPHETAMINE 30 MG PO TABS
30.0000 mg | ORAL_TABLET | Freq: Two times a day (BID) | ORAL | 0 refills | Status: AC
Start: 2023-01-28 — End: ?

## 2023-01-28 MED ORDER — CLONAZEPAM 1 MG PO TABS
1.0000 mg | ORAL_TABLET | Freq: Every day | ORAL | 1 refills | Status: AC
Start: 2023-01-28 — End: ?

## 2023-01-28 NOTE — Patient Instructions (Signed)
Get approval for Oscar Mercado to submit disability forms for [place] to fill out  Refills on clonazepam 1 mg and Adderall

## 2023-01-28 NOTE — Progress Notes (Signed)
Subjective:    Patient ID: Oscar Mercado, male    DOB: [birth year], 43 y.o.   MRN: [id number]  HPI I connected with  Oscar Mercado on 01/28/23 by phone/  video enabled telemedicine application and verified that I am speaking with the correct person using two identifiers.     Location: Patient: Home Provider: Office - [facility] - [employer], [employer]   I discussed the limitations of evaluation and management by telemedicine and the availability of in person appointments. The patient expressed understanding and agreed to proceed. I also discussed with the patient that there may be a patient responsible charge related to this service. The patient expressed understanding and agreed to proceed.   Patient consented to consult via telephone: Yes People present and their role in pt care: Pt     43 y.o. 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  -REM/Non REM parasomnias.  VS patient who is establishing with me today  Meds : armodafinil caused palpitations  venlafaxine did not work  Last OV 11/25/22-started approval process for [employer]   - still having significant daytime sleepiness and frequent episodes of cataplexy impacting his ability to function during the day   Unfortunately has not received Wakix yet.  I see that the paperwork was filled out in October.  He would like refills on clonazepam and  Adderall.  He feels that the Adderall does not work, sometimes he feels that coffee works better than Adderall.  Venlafaxine did not help much. He feels that he has residual tiredness and sleepiness during the day and he wonders if clonazepam may be causing that. Today he is concerned about his application for disability.  His lawyer is helping him out and he would like to submit papers to [place] to support.  He reports frequent awakenings when he sleeps at night.  He is afraid of dependency and Klonopin. He used to work in the [employer] he went out on long-term  disability December 2022 and has not been able to go back to work since.    Significant tests/ events reviewed  PSG 11/06/18 >> SO 0.4 minutes, total sleep time 394.5 minutes, frequent REM with fragmented sleep, AHI 0.6, SpO2 low 89%, PLMI 0 MSLT 11/08/18 >> 5/5 naps with sleep, mean sleep latency 1 second, 4/5 naps with SOREM  Review of Systems neg for any significant sore throat, dysphagia, itching, sneezing, nasal congestion or excess/ purulent secretions, fever, chills, sweats, unintended wt loss, pleuritic or exertional cp, hempoptysis, orthopnea pnd or change in chronic leg swelling. Also denies presyncope, palpitations, heartburn, abdominal pain, nausea, vomiting, diarrhea or change in bowel or urinary habits, dysuria,hematuria, rash, arthralgias, visual complaints, headache, numbness weakness or ataxia.     Objective:   Physical Exam  Alert, interactive Normal speech, no accessory muscle use      Assessment & Plan:   Narcolepsy with cataplexy -continue Adderall for now, refills will be provided. Will check with pharmacy about approval for [facility] -will need dose titration, for now this would be second line medication in addition to Adderall. Armodafinil has not been tolerated in the past due to palpitations. In the future, if nothing else works we can consider oxybate-Long acting version He will submit disability papers and we will fill this out  Parasomnias -he was started on Klonopin but I am concerned about residual somnolence and will decrease to 1 mg with the eventual goal to taper this off.    Total encounter time 35 minutes  Kathy Breach  Wallace Keller MD

## 2023-01-28 NOTE — Telephone Encounter (Signed)
I see that request for Oscar Mercado was faxed to specialty pharmacy on 10/8, scanned in media tab. Can we asked pharmacy to follow-up on this or check with specialty pharmacy / rep ? Patient has not received this yet

## 2023-01-31 NOTE — Telephone Encounter (Signed)
Called [phone] and spoke with a representative regarding the referral for the [facility].  I was advised that they did not receive a referral for this patient.  I advised I would fax the referral again.  I verified the fax #.  I was told that it could take up to 24 hours to be scanned into their system.  Form faxed to [phone], received confirmation fax that it was sent successfully.  Will leave in triage to make sure patient receives his medication.

## 2023-02-09 NOTE — Telephone Encounter (Signed)
Oscar Mercado states needs enrollment form sent back with correct birthdate. Oscar Mercado phone number is [phone].

## 2023-02-17 NOTE — Telephone Encounter (Addendum)
Called and spoke with Denny Peon at [facility].  She states the handwriting on the form is not legible and the birth date is incorrect.  Please refax the forms  to [phone].  Printed forms from media tab in Epic, printed DOB at top of form, and refaxed form to the fax number above.

## 2023-03-03 ENCOUNTER — Telehealth: Payer: Self-pay | Admitting: Pulmonary Disease

## 2023-03-03 NOTE — Telephone Encounter (Signed)
Oscar Mercado from [facility] is calling. She states the handwriting on the form is not legible and the birth date is incorrect. Please refax the forms to [phone]. They never recived the forms from December the 12th only they November form.

## 2023-03-11 NOTE — Telephone Encounter (Signed)
 I called and spoke with Erin from [employer] . She verbally told me that I could verify pt's information over the phone, and no pt or provider signature would be needed. Due to Dr Shellia no longer being in our practice, the last provider to see the pt was Dr Jude. Pt was seen virtually by Dr Jude on 01-28-2023. Rocky verified information with me regarding pt and is sending the form to be cleared and if needed, to the prior authorizations. Nothing further needed.

## 2023-03-11 NOTE — Telephone Encounter (Signed)
  Busick, Sabra DASEN, CMA  Certified Medical Assistant   Telephone Encounter Signed   Encounter Date: 03/03/2023   Signed      I called and spoke with Erin from [employer] . She verbally told me that I could verify pt's information over the phone, and no pt or provider signature would be needed. Due to Dr Shellia no longer being in our practice, the last provider to see the pt was Dr Jude. Pt was seen virtually by Dr Jude on 01-28-2023. Rocky verified information with me regarding pt and is sending the form to be cleared and if needed, to the prior authorizations. Nothing further needed.

## 2023-03-17 ENCOUNTER — Telehealth: Payer: Self-pay

## 2023-03-17 ENCOUNTER — Other Ambulatory Visit (HOSPITAL_COMMUNITY): Payer: Self-pay

## 2023-03-17 ENCOUNTER — Telehealth: Payer: Self-pay | Admitting: Adult Health

## 2023-03-17 NOTE — Telephone Encounter (Signed)
 Pharmacy Patient Advocate Encounter   Received notification from CoverMyMeds that prior authorization for [employer]  17.8MG  tablets is required/requested.   Insurance verification completed.   The patient is insured through [facility] .   Per test claim: PA required; PA submitted to above mentioned insurance via CoverMyMeds Key/confirmation #/EOC [id number] Status is pending

## 2023-03-17 NOTE — Telephone Encounter (Signed)
 Lmom for pt to sch cpe or office visit

## 2023-03-18 NOTE — Telephone Encounter (Signed)
 [employer] calling to be sure we have the key # for CoverMY Meds.   Key # is   [id number] [id number]  17.8 [id number]

## 2023-03-18 NOTE — Telephone Encounter (Signed)
 Pharmacy Patient Advocate Encounter  Received notification from [facility] that Prior Authorization for [employer] 17.8MG  tablets has been APPROVED from 03-18-2023 to 03-16-2024   PA #/Case ID/Reference #: [id number]

## 2023-03-25 ENCOUNTER — Telehealth: Payer: Self-pay | Admitting: Pulmonary Disease

## 2023-03-25 DIAGNOSIS — G47411 Narcolepsy with cataplexy: Secondary | ICD-10-CM

## 2023-03-25 MED ORDER — WAKIX 17.8 MG PO TABS: 2.0000 | ORAL_TABLET | Freq: Every day | ORAL | 11 refills | Status: AC

## 2023-03-25 NOTE — Telephone Encounter (Signed)
Rx sent to pharmacy   

## 2023-03-25 NOTE — Telephone Encounter (Signed)
[employer] prescription needs a Science writer. Please resend with a signature. Fax number [phone]

## 2023-03-29 ENCOUNTER — Telehealth: Payer: Self-pay | Admitting: Pulmonary Disease

## 2023-03-29 NOTE — Telephone Encounter (Signed)
Oscar Mercado states needs signature for [employer] taper. Oscar Mercado phone number is [phone]. Fax number is [phone].

## 2023-03-31 NOTE — Telephone Encounter (Signed)
I called and spoke with the rep at [facility]  They are needing initial start form signed and faxed- they can not accept this electronically   Please print this and sign and fax   [email or web address]

## 2023-03-31 NOTE — Telephone Encounter (Signed)
Form already on desk waiting for signature.

## 2023-03-31 NOTE — Telephone Encounter (Signed)
Will checking on signature for [facility] taper. Will phone number is [phone].

## 2023-04-07 ENCOUNTER — Telehealth: Payer: Self-pay | Admitting: Pulmonary Disease

## 2023-04-07 NOTE — Telephone Encounter (Signed)
Oscar Mercado is calling again. No signed form has been rec'd. Please submit or resubmit.

## 2023-04-11 NOTE — Telephone Encounter (Signed)
 Faxed

## 2023-04-14 NOTE — Telephone Encounter (Signed)
 Lvmtcb and placed recall

## 2023-04-14 NOTE — Telephone Encounter (Signed)
 NFN

## 2023-04-15 ENCOUNTER — Telehealth: Payer: Self-pay | Admitting: Pulmonary Disease

## 2023-04-15 ENCOUNTER — Telehealth (HOSPITAL_BASED_OUTPATIENT_CLINIC_OR_DEPARTMENT_OTHER): Payer: Self-pay

## 2023-04-15 NOTE — Telephone Encounter (Signed)
 Troy with [employer]  came by office. Dose was changed from Dr Sood to a lower dose by Dr Villa Greaser. Titration to 35.8 is preferred with refills on 35.8 to maintain. Order has been changed verbally yo match that with refills to last 1 year.

## 2023-04-15 NOTE — Telephone Encounter (Signed)
 Oscar Mercado states not able to reach patient by phone. Robin phone number is [phone].

## 2023-04-15 NOTE — Telephone Encounter (Signed)
 I have left a message on their machine to have them return call. No answer at this time.

## 2023-04-20 ENCOUNTER — Encounter (HOSPITAL_BASED_OUTPATIENT_CLINIC_OR_DEPARTMENT_OTHER): Payer: Self-pay

## 2023-04-20 NOTE — Telephone Encounter (Signed)
Detailed message left on identifiable machine belonging to the patient. Advised to call with any questions or concerns.  Mychart message also sent with info. NFN

## 2023-05-19 NOTE — Telephone Encounter (Signed)
 PT's specialty Pharmacy, [facility], has discontinued the PT because they are unreachable.  [facility] # is [phone]
